# Patient Record
Sex: Female | Born: 1955 | Race: White | Hispanic: No | Smoking: Never smoker
Health system: Southern US, Community
[De-identification: ages and names within clinical notes are randomized; demographics above are authoritative.]

## PROBLEM LIST (undated history)

## (undated) DIAGNOSIS — J45991 Cough variant asthma: Secondary | ICD-10-CM

## (undated) DIAGNOSIS — Z860100 Personal history of colon polyps, unspecified: Secondary | ICD-10-CM

## (undated) DIAGNOSIS — J40 Bronchitis, not specified as acute or chronic: Secondary | ICD-10-CM

## (undated) DIAGNOSIS — K219 Gastro-esophageal reflux disease without esophagitis: Secondary | ICD-10-CM

## (undated) DIAGNOSIS — K2970 Gastritis, unspecified, without bleeding: Secondary | ICD-10-CM

## (undated) HISTORY — PX: TRIGGER FINGER RELEASE: SHX641

## (undated) HISTORY — DX: Bronchitis, not specified as acute or chronic: J40

## (undated) HISTORY — PX: BACK SURGERY: SHX140

## (undated) HISTORY — PX: SEPTOPLASTY: SHX2393

## (undated) HISTORY — PX: BREAST BIOPSY: SHX20

## (undated) HISTORY — PX: NECK SURGERY: SHX720

## (undated) HISTORY — DX: Personal history of colon polyps, unspecified: Z86.0100

## (undated) HISTORY — DX: Cough variant asthma: J45.991

## (undated) HISTORY — PX: DILATION AND CURETTAGE OF UTERUS: SHX78

## (undated) HISTORY — DX: Personal history of colonic polyps: Z86.010

---

## 1997-05-22 ENCOUNTER — Other Ambulatory Visit: Admission: RE | Admit: 1997-05-22 | Discharge: 1997-05-22 | Payer: Self-pay | Admitting: Gynecology

## 1997-09-30 ENCOUNTER — Other Ambulatory Visit: Admission: RE | Admit: 1997-09-30 | Discharge: 1997-09-30 | Payer: Self-pay | Admitting: Gynecology

## 1997-10-12 ENCOUNTER — Other Ambulatory Visit: Admission: RE | Admit: 1997-10-12 | Discharge: 1997-10-12 | Payer: Self-pay | Admitting: Gynecology

## 1997-11-25 ENCOUNTER — Encounter: Payer: Self-pay | Admitting: Emergency Medicine

## 1997-11-25 ENCOUNTER — Emergency Department (HOSPITAL_COMMUNITY): Admission: EM | Admit: 1997-11-25 | Discharge: 1997-11-25 | Payer: Self-pay | Admitting: Emergency Medicine

## 1999-03-02 ENCOUNTER — Encounter: Admission: RE | Admit: 1999-03-02 | Discharge: 1999-03-02 | Payer: Self-pay | Admitting: Plastic Surgery

## 1999-03-02 ENCOUNTER — Encounter: Payer: Self-pay | Admitting: Plastic Surgery

## 2000-09-09 ENCOUNTER — Other Ambulatory Visit: Admission: RE | Admit: 2000-09-09 | Discharge: 2000-09-09 | Payer: Self-pay | Admitting: Gynecology

## 2000-09-17 ENCOUNTER — Other Ambulatory Visit: Admission: RE | Admit: 2000-09-17 | Discharge: 2000-09-17 | Payer: Self-pay | Admitting: Gynecology

## 2000-10-21 ENCOUNTER — Encounter: Admission: RE | Admit: 2000-10-21 | Discharge: 2000-10-21 | Payer: Self-pay | Admitting: Gynecology

## 2000-10-21 ENCOUNTER — Encounter: Payer: Self-pay | Admitting: Gynecology

## 2001-10-23 ENCOUNTER — Encounter: Payer: Self-pay | Admitting: Gynecology

## 2001-10-23 ENCOUNTER — Encounter: Admission: RE | Admit: 2001-10-23 | Discharge: 2001-10-23 | Payer: Self-pay | Admitting: Gynecology

## 2001-10-29 ENCOUNTER — Other Ambulatory Visit: Admission: RE | Admit: 2001-10-29 | Discharge: 2001-10-29 | Payer: Self-pay | Admitting: Gynecology

## 2002-05-22 ENCOUNTER — Encounter: Payer: Self-pay | Admitting: Gynecology

## 2002-05-22 ENCOUNTER — Inpatient Hospital Stay (HOSPITAL_COMMUNITY): Admission: EM | Admit: 2002-05-22 | Discharge: 2002-05-25 | Payer: Self-pay | Admitting: Gynecology

## 2002-05-23 ENCOUNTER — Encounter: Payer: Self-pay | Admitting: Gynecology

## 2003-10-13 ENCOUNTER — Other Ambulatory Visit: Admission: RE | Admit: 2003-10-13 | Discharge: 2003-10-13 | Payer: Self-pay | Admitting: Gynecology

## 2003-11-15 ENCOUNTER — Encounter: Admission: RE | Admit: 2003-11-15 | Discharge: 2003-11-15 | Payer: Self-pay | Admitting: Gynecology

## 2004-10-21 ENCOUNTER — Encounter (INDEPENDENT_AMBULATORY_CARE_PROVIDER_SITE_OTHER): Payer: Self-pay | Admitting: Specialist

## 2004-10-21 ENCOUNTER — Ambulatory Visit (HOSPITAL_COMMUNITY): Admission: RE | Admit: 2004-10-21 | Discharge: 2004-10-21 | Payer: Self-pay | Admitting: Gastroenterology

## 2004-11-02 ENCOUNTER — Other Ambulatory Visit: Admission: RE | Admit: 2004-11-02 | Discharge: 2004-11-02 | Payer: Self-pay | Admitting: Gynecology

## 2005-01-23 ENCOUNTER — Encounter: Admission: RE | Admit: 2005-01-23 | Discharge: 2005-01-23 | Payer: Self-pay | Admitting: Gynecology

## 2006-04-19 ENCOUNTER — Encounter: Admission: RE | Admit: 2006-04-19 | Discharge: 2006-04-19 | Payer: Self-pay | Admitting: Gynecology

## 2006-08-15 ENCOUNTER — Ambulatory Visit (HOSPITAL_BASED_OUTPATIENT_CLINIC_OR_DEPARTMENT_OTHER): Admission: RE | Admit: 2006-08-15 | Discharge: 2006-08-15 | Payer: Self-pay | Admitting: Orthopedic Surgery

## 2007-09-09 ENCOUNTER — Encounter: Admission: RE | Admit: 2007-09-09 | Discharge: 2007-09-09 | Payer: Self-pay | Admitting: Obstetrics and Gynecology

## 2007-09-12 ENCOUNTER — Encounter: Admission: RE | Admit: 2007-09-12 | Discharge: 2007-09-12 | Payer: Self-pay | Admitting: Obstetrics and Gynecology

## 2007-09-12 ENCOUNTER — Encounter (INDEPENDENT_AMBULATORY_CARE_PROVIDER_SITE_OTHER): Payer: Self-pay | Admitting: Radiology

## 2007-10-07 ENCOUNTER — Other Ambulatory Visit: Admission: RE | Admit: 2007-10-07 | Discharge: 2007-10-07 | Payer: Self-pay | Admitting: Obstetrics and Gynecology

## 2008-04-06 ENCOUNTER — Ambulatory Visit (HOSPITAL_BASED_OUTPATIENT_CLINIC_OR_DEPARTMENT_OTHER): Admission: RE | Admit: 2008-04-06 | Discharge: 2008-04-06 | Payer: Self-pay | Admitting: Orthopedic Surgery

## 2008-07-16 ENCOUNTER — Ambulatory Visit (HOSPITAL_BASED_OUTPATIENT_CLINIC_OR_DEPARTMENT_OTHER): Admission: RE | Admit: 2008-07-16 | Discharge: 2008-07-16 | Payer: Self-pay | Admitting: Orthopedic Surgery

## 2008-09-30 ENCOUNTER — Ambulatory Visit: Payer: Self-pay | Admitting: Critical Care Medicine

## 2008-09-30 DIAGNOSIS — J45991 Cough variant asthma: Secondary | ICD-10-CM

## 2008-09-30 DIAGNOSIS — R51 Headache: Secondary | ICD-10-CM

## 2008-09-30 DIAGNOSIS — K219 Gastro-esophageal reflux disease without esophagitis: Secondary | ICD-10-CM

## 2008-09-30 DIAGNOSIS — J40 Bronchitis, not specified as acute or chronic: Secondary | ICD-10-CM

## 2008-09-30 DIAGNOSIS — J309 Allergic rhinitis, unspecified: Secondary | ICD-10-CM | POA: Insufficient documentation

## 2008-09-30 DIAGNOSIS — R519 Headache, unspecified: Secondary | ICD-10-CM | POA: Insufficient documentation

## 2008-10-01 ENCOUNTER — Encounter: Admission: RE | Admit: 2008-10-01 | Discharge: 2008-10-01 | Payer: Self-pay | Admitting: Obstetrics and Gynecology

## 2008-10-03 ENCOUNTER — Encounter: Payer: Self-pay | Admitting: Critical Care Medicine

## 2008-10-19 ENCOUNTER — Ambulatory Visit: Payer: Self-pay | Admitting: Critical Care Medicine

## 2008-12-11 ENCOUNTER — Emergency Department (HOSPITAL_BASED_OUTPATIENT_CLINIC_OR_DEPARTMENT_OTHER): Admission: EM | Admit: 2008-12-11 | Discharge: 2008-12-11 | Payer: Self-pay | Admitting: Emergency Medicine

## 2008-12-11 ENCOUNTER — Ambulatory Visit: Payer: Self-pay | Admitting: Radiology

## 2009-02-07 ENCOUNTER — Telehealth: Payer: Self-pay | Admitting: Critical Care Medicine

## 2009-07-07 ENCOUNTER — Encounter: Payer: Self-pay | Admitting: Critical Care Medicine

## 2009-07-07 ENCOUNTER — Telehealth (INDEPENDENT_AMBULATORY_CARE_PROVIDER_SITE_OTHER): Payer: Self-pay | Admitting: *Deleted

## 2009-07-08 ENCOUNTER — Telehealth: Payer: Self-pay | Admitting: Critical Care Medicine

## 2009-07-08 ENCOUNTER — Ambulatory Visit: Payer: Self-pay | Admitting: Critical Care Medicine

## 2009-07-19 ENCOUNTER — Ambulatory Visit: Payer: Self-pay | Admitting: Critical Care Medicine

## 2009-11-08 ENCOUNTER — Ambulatory Visit: Payer: Self-pay | Admitting: Internal Medicine

## 2009-11-08 DIAGNOSIS — Z860100 Personal history of colon polyps, unspecified: Secondary | ICD-10-CM | POA: Insufficient documentation

## 2009-11-08 DIAGNOSIS — B029 Zoster without complications: Secondary | ICD-10-CM | POA: Insufficient documentation

## 2010-03-26 ENCOUNTER — Encounter: Payer: Self-pay | Admitting: Otolaryngology

## 2010-04-04 NOTE — Progress Notes (Signed)
Summary: Needs Appt  Phone Note Outgoing Call   Reason for Call: Confirm/change Appt Summary of Call: make an appt for this pt with me next week I sent pred pulse to her pharmacy Initial call taken by: Storm Frisk MD,  Jul 07, 2009 10:07 AM  Follow-up for Phone Call        called pt at her office and [facility] with the secretary for pt to call back once she is out of surgery to set appt. Carron Curie CMA  Jul 07, 2009 10:19 AM   Additional Follow-up for Phone Call Additional follow up Details #1::        Pt has scheduled ov on May 13 at 330 with the schedulers.  Will sign off on note.  Gweneth Dimitri RN  Jul 07, 2009 4:59 PM

## 2010-04-04 NOTE — Miscellaneous (Signed)
Summary: Pred Rx  Clinical Lists Changes  Medications: Added new medication of PREDNISONE 10 MG  TABS (PREDNISONE) Take as directed Take 4 daily for two days, then 3 daily for two days, then two daily for two days then one daily for two days then stop - Signed Rx of PREDNISONE 10 MG  TABS (PREDNISONE) Take as directed Take 4 daily for two days, then 3 daily for two days, then two daily for two days then one daily for two days then stop;  #20 x 0;  Signed;  Entered by: Storm Frisk MD;  Authorized by: Storm Frisk MD;  Method used: [facility] [address] [phone]*, [address] [place], [place], [place]  [address], Ph: [phone] or [phone], Fax: [phone]    Prescriptions: PREDNISONE 10 MG  TABS (PREDNISONE) Take as directed Take 4 daily for two days, then 3 daily for two days, then two daily for two days then one daily for two days then stop  #20 x 0   Entered and Authorized by:   Storm Frisk MD   Signed by:   Storm Frisk MD on 07/07/2009   Method used:   [facility]ronically to        [facility] [address] [phone]* (retail)       [address]       [place], [place]  [address]       Ph: [phone] or [phone]       Fax: [phone]   RxID:   [id number]

## 2010-04-04 NOTE — Assessment & Plan Note (Signed)
Summary: Pulmonary OV   Copy to:  collins Primary Provider/Referring Provider:  Dr. Jeri Modena  CC:  Follow up.  Pt states SOB when talking and prod cough with white to clear mucus and occ creamy mucus returned x 1 1/2 wks.  .  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  October 19, 2008 3:47 PM Pt is markedly better. The cough has resolved.  There is no wheeze.  Am PFR 450 and improves to 550 by evening. No pn drip. Not using rescue inhaler Pt denies any significant sore throat, nasal congestion or excess secretions, fever, chills, sweats, unintended weight loss, pleurtic or exertional chest pain, orthopnea PND, or leg swelling Pt denies any increase in rescue therapy over baseline, denies waking up needing it or having any early am or nocturnal exacerbations of coughing/wheezing/or dyspnea.  Jul 08, 2009 3:52 PM Same symptoms as last summer.  all went away and weaned off zyrtec , inhalers off since 8/10 symptoms return in 2/11.  more pndrip,  ent suggested .  flonase and astelin plus zyrtec drainage better until mid march, then strep throat and ent rx cleocin for 10days then ok until one week cough came back.  seal like barky cough and still pndrip had stopped all the nasal meds and zyrtec 3weeks ago. then went back on all three allergy meds,  then this week tuesday worse with talking resumed qvar, and proair as needed.   not much better for two days did get pred and took 40mg  last night   Preventive Screening-Counseling & Management  Alcohol-Tobacco     Smoking Status: never  Current Medications (verified): 1)  Protonix 40 Mg Tbec (Pantoprazole Sodium) .... Once Daily 2)  Topamax 25 Mg Tabs (Topiramate) .... 3 Tabs At Bedtime As Needed 3)  Zomig 5 Mg Tabs (Zolmitriptan) .... As Needed For Headaches 4)  Qvar 40 Mcg/act  Aers  (Beclomethasone Dipropionate) .... Two Puffs  Daily 5)  Proair Hfa 108 (90 Base) Mcg/act  Aers (Albuterol Sulfate) .[name].. 1-2 Puffs Every 4-6 Hours As Needed 6)  Aerochamber Mv  Misc (Spacer/aero-Holding Chambers) .... Use With Qvar 7)  Prednisone 10 Mg  Tabs (Prednisone) .... Take As Directed Take 4 Daily For Two Days, Then 3 Daily For Two Days, Then Two Daily For Two Days Then One Daily For Two Days Then Stop 8)  Zyrtec Allergy 10 Mg Tabs (Cetirizine Hcl) .... Once Daily 9)  Azelastine Hcl 137 Mcg/spray Soln (Azelastine Hcl) .... 2 Sprays Two Times A Day 10)  Fluticasone Propionate 50 Mcg/act Susp (Fluticasone Propionate) .... 2 Sprays Once Daily 11)  Mucinex Dm 30-600 Mg Xr12h-Tab (Dextromethorphan-Guaifenesin) .... Once Daily  Allergies (verified): 1)  Sulfa  Past History:  Past medical, surgical, family and social histories (including risk factors) reviewed, and no changes noted (except as noted below).  Past Medical History: Reviewed history from 09/30/2008 and no changes required. BRONCHITIS (ICD-490) HEADACHE, CHRONIC (ICD-784.0)  Past Surgical History: Reviewed history from 09/30/2008 and no changes required. neck/back surgery triger finger releases septo plasty D&C Breast Bx  Family History: Reviewed history from 09/30/2008 and no changes required. DGU:YQIHKV, father, 2 siblings QQV:ZDGLOV Heart attack:father Lupus:maternal aunt arthritis:mother breast CA-maternal aunt prostate CA;maternal uncle  Social History: Reviewed history from 09/30/2008 and no changes required. Patient never smoked.  Social drinker Married, lives with husband No biological  childern, does have stepchidren Pt is a plastic surgian.  Review of Systems       The patient complains of shortness of breath with activity, shortness of breath at rest, productive cough, non-productive cough, sore throat, and nasal congestion/difficulty breathing through nose.  The patient denies coughing up blood,  chest pain, irregular heartbeats, acid heartburn, indigestion, loss of appetite, weight change, abdominal pain, difficulty swallowing, tooth/dental problems, headaches, sneezing, itching, ear ache, anxiety, depression, hand/feet swelling, joint stiffness or pain, rash, change in color of mucus, and fever.    Vital Signs:  Patient profile:   55 year old female Height:      66 inches Weight:      144 pounds BMI:     23.33 O2 Sat:      96 % on Room air Temp:     97.9 degrees F oral Pulse rate:   88 / minute BP sitting:   140 / 90  (right arm) Cuff size:   regular  Vitals Entered By: Gweneth Dimitri RN (Jul 08, 2009 3:43 PM)  O2 Flow:  Room air CC: Follow up.  Pt states SOB when talking and prod cough with white to clear mucus and occ creamy mucus returned x 1 1/2 wks.   Comments Medications reviewed with patient Daytime contact number verified with patient. Gweneth Dimitri RN  Jul 08, 2009 3:44 PM    Physical Exam  Additional Exam:  Gen: Pleasant, well-nourished, in no distress,  normal affect ENT: No lesions,  mouth clear,  oropharynx clear, positive pndrip,  erythematous  Neck: No JVD, no TMG, no carotid bruits Lungs: No use of accessory muscles, no dullness to percussion, upper airway wheeze Cardiovascular: RRR, heart sounds normal, no murmur or gallops, no peripheral edema Abdomen: soft and NT, no HSM,  BS normal Musculoskeletal: No deformities, no cyanosis or clubbing Neuro: alert, non focal Skin: Warm, no lesions or rashes    Pulmonary Function Test Date: 07/08/2009 4:06 PM Gender: Female  Pre-Spirometry FVC    Value: 3.37 L/min   % Pred: 90.70 % FEV1    Value: 2.72 L     Pred: 2.93 L     % Pred: 92.80 % FEV1/FVC  Value: 80.64 %     % Pred: 101.10 %  Impression & Recommendations:  Problem # 1:  BRONCHITIS (ICD-490) Assessment Deteriorated Acute tracheobronchits and GERD flare both result in mild asthma flare and upper airway instability syndrome/vocal cord  dysfunction Note normal spirometry plan Zpak prednisone pulse resume qvar as needed tramadol for cough   The following medications were removed from the medication list:    Zithromax Z-pak 250 Mg Tabs (Azithromycin) .[name]... Take 2 tablets today and then one tablet daily until gone. Her updated medication list for this problem includes:    Qvar 40 Mcg/act Aers (Beclomethasone dipropionate) .[name]... Three puffs twice daily for 5 days then two puff twice a day for two cannisters then stop    Proair Hfa 108 (90 Base) Mcg/act Aers (Albuterol sulfate) .[name]... 1-2 puffs every 4-6 hours as needed    Mucinex Dm 30-600 Mg Xr12h-tab (Dextromethorphan-guaifenesin) ..... Once daily    Azithromycin 250 Mg Tabs (Azithromycin) .[name]... Take two by mouth once then one daily until gone  Orders: Est. Patient Level IV (98119)  Medications Added to Medication List This Visit: 1)  Topamax 25 Mg Tabs (Topiramate) .... 3 tabs at bedtime as needed 2)  Qvar 40 Mcg/act Aers (Beclomethasone dipropionate) .... Three puffs twice daily for  5 days then two puff twice a day for two cannisters then stop 3)  Qvar 40 Mcg/act Aers (Beclomethasone dipropionate) .... Three puffs twice daily 4)  Zyrtec Allergy 10 Mg Tabs (Cetirizine hcl) .... Once daily 5)  Azelastine Hcl 137 Mcg/spray Soln (Azelastine hcl) .... 2 sprays two times a day 6)  Fluticasone Propionate 50 Mcg/act Susp (Fluticasone propionate) .... 2 sprays once daily 7)  Mucinex Dm 30-600 Mg Xr12h-tab (Dextromethorphan-guaifenesin) .... Once daily 8)  Proair Hfa 108 (90 Base) Mcg/act Aers (Albuterol sulfate) .[name].. 1-2 puffs every 4-6 hours as needed 9)  Azithromycin 250 Mg Tabs (Azithromycin) .... Take two by mouth once then one daily until gone 10)  Tramadol Hcl 50 Mg Tabs (Tramadol hcl) .... One to two by mouth every 4-6 hours as needed cough  Complete Medication List: 1)  Protonix 40 Mg Tbec (Pantoprazole sodium) .... Once daily 2)  Topamax 25 Mg Tabs (Topiramate) ....  3 tabs at bedtime as needed 3)  Zomig 5 Mg Tabs (Zolmitriptan) .... As needed for headaches 4)  Qvar 40 Mcg/act Aers (Beclomethasone dipropionate) .... Three puffs twice daily for 5 days then two puff twice a day for two cannisters then stop 5)  Proair Hfa 108 (90 Base) Mcg/act Aers (Albuterol sulfate) .[name].. 1-2 puffs every 4-6 hours as needed 6)  Aerochamber Mv Misc (Spacer/aero-holding chambers) .... Use with qvar 7)  Prednisone 10 Mg Tabs (Prednisone) .... Take as directed take 4 daily for two days, then 3 daily for two days, then two daily for two days then one daily for two days then stop 8)  Zyrtec Allergy 10 Mg Tabs (Cetirizine hcl) .... Once daily 9)  Azelastine Hcl 137 Mcg/spray Soln (Azelastine hcl) .... 2 sprays two times a day 10)  Fluticasone Propionate 50 Mcg/act Susp (Fluticasone propionate) .... 2 sprays once daily 11)  Mucinex Dm 30-600 Mg Xr12h-tab (Dextromethorphan-guaifenesin) .... Once daily 12)  Proair Hfa 108 (90 Base) Mcg/act Aers (Albuterol sulfate) .[name].. 1-2 puffs every 4-6 hours as needed 13)  Azithromycin 250 Mg Tabs (Azithromycin) .... Take two by mouth once then one daily until gone 14)  Tramadol Hcl 50 Mg Tabs (Tramadol hcl) .... One to two by mouth every 4-6 hours as needed cough  Other Orders: Spirometry w/Graph (94010)  Patient Instructions: 1)  Qvar three puff twice daily for 5 days , then two puff twice daily 2)  Finish prednisone 3)  Take zithromax , two times one dose, then one daily until gone 4)  Stay on nasal sprays , zyrtec 5)  Stay on protonix, remember to take 1/2 hour before meals then eat 6)  Stop mucinex DM 7)  Use Tramadol one to two every 6 hours as needed for cough 8)  Call in one week with an update Prescriptions: TRAMADOL HCL 50 MG  TABS (TRAMADOL HCL) One to two by mouth every 4-6 hours as needed cough  #30 x 0   Entered and Authorized by:   Storm Frisk MD   Signed by:   Storm Frisk MD on 07/08/2009   Method used:    [facility]        [facility] [address] [phone]* (retail)       [address]       [place], [place]  [address]       Ph: [phone] or [phone]       Fax: [phone]   RxID:   [id number] AZITHROMYCIN  250 MG TABS (AZITHROMYCIN) Take two by mouth once then one daily until gone  #7 x 0   Entered and Authorized by:   Storm Frisk MD   Signed by:   Storm Frisk MD on 07/08/2009   Method used:   [facility]ronically to        [facility] [address] [phone]* (retail)       [address]       [place], [place]  [address]       Ph: [phone] or [phone]       Fax: [phone]   RxID:   [id number] QVAR 40 MCG/ACT  AERS (BECLOMETHASONE DIPROPIONATE) Three puffs twice daily for 5 days then two puff twice a day for two cannisters then stop  #1 x 1   Entered and Authorized by:   Storm Frisk MD   Signed by:   Storm Frisk MD on 07/08/2009   Method used:   Electronically to        [facility] [address] [phone]* (retail)       [address]       [place], [place]  [address]       Ph: [phone] or [phone]       Fax: [phone]   RxID:   [id number]   Prevention & Chronic Care Immunizations   Influenza vaccine: Not documented    Tetanus booster: Not documented    Pneumococcal vaccine: Not documented  Colorectal Screening   Hemoccult: Not documented    Colonoscopy: Not documented  Other Screening   Pap smear: Not documented    Mammogram: Not documented   Smoking status: never  (07/08/2009)  Lipids   Total Cholesterol: Not documented   LDL: Not documented   LDL Direct: Not documented   HDL: Not documented   Triglycerides: Not documented   CardioPerfect Spirometry  ID: [id number] Patient: Whitney [name], DESSERT DOB: 08/15/1955 Age: 55 Years Old Sex: Female Race: White Height: 66 Weight: 144 Status: Confirmed Past Medical History:  BRONCHITIS  (ICD-490) HEADACHE, CHRONIC (ICD-784.0)   Recorded: 07/08/2009 4:06 PM  Parameter  Measured Predicted %Predicted FVC     3.37        3.72        90.70 FEV1     2.72        2.93        92.80 FEV1%   80.64        79.76        101.10 PEF    7.42        6.94        107   Comments: normal spirometry  Interpretation: Pre: FVC= 3.37L FEV1= 2.72L FEV1%= 80.6% 2.72/3.37 FEV1/FVC (07/08/2009 4:07:17 PM), Within normal limits

## 2010-04-04 NOTE — Progress Notes (Signed)
Summary: appt  Phone Note Call from Patient Call back at Work Phone [phone]   Caller: Patient Call For: wright Summary of Call: Wants to see PW today also needs directions for how she is to take prednisone. Initial call taken by: Darletta Moll,  Jul 08, 2009 9:07 AM  Follow-up for Phone Call        pt wants to be worked today at 3:30 or later instead of coming in end of next week. Please advise if ok to schedule appt. Thanks.Carron Curie CMA  Jul 08, 2009 9:39 AM  TD spoke with Dr. Delford Field, ok to place pt at 3:30. pt aware. she will doscuss pred at the appt. Carron Curie CMA  Jul 08, 2009 9:47 AM

## 2010-04-04 NOTE — Assessment & Plan Note (Signed)
Summary: NEW / Whitney [name] / CD   Vital Signs:  Patient profile:   55 year old female Height:      66 inches Weight:      146 pounds BMI:     23.65 O2 Sat:      96 % on Room air Temp:     97.5 degrees F oral Pulse rate:   80 / minute BP sitting:   122 / 62  (left arm) Cuff size:   regular  Vitals Entered By: Alysia Penna (November 08, 2009 4:51 PM)  O2 Flow:  Room air CC: pt has bug bites on multiple locations. soreness on back/buttocks area. some itching & joint pain. /cp sma Comments pt not using proair or tramadol.    Primary Care Provider:  Dr. Jeri Modena  CC:  pt has bug bites on multiple locations. soreness on back/buttocks area. some itching & joint pain. /cp sma.  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 She thought she may have had a insect envenomation but the pain has become very bothersome involving the right buttock to the top of the posterior thigh. She has scattered mild upper extremity lesions that are very small and not painful. She has applied a drying agent and some topical antibiotic to the lesion on the buttock and sacrum. She first noted the "bite" Friday but has had new "bites" and the pain has been noticeable for the past 2-3 days.   Current Medications (verified): 1)  Protonix 40 Mg Tbec (Pantoprazole Sodium) .... Once Daily 2)  Topamax 25 Mg Tabs (Topiramate) .... 3 Tabs At Bedtime As Needed 3)  Zomig 5 Mg Tabs (Zolmitriptan) .... As Needed For Headaches 4)  Zyrtec Allergy 10 Mg Tabs (Cetirizine Hcl) .... Once Daily 5)  Azelastine Hcl 137 Mcg/spray Soln (Azelastine Hcl) .... 2 Sprays Two Times A Day 6)  Fluticasone Propionate 50 Mcg/act Susp (Fluticasone Propionate) .... 2 Sprays Once Daily 7)  Proair Hfa 108 (90 Base) Mcg/act  Aers (Albuterol Sulfate) .[name].. 1-2 Puffs Every 4-6 Hours As Needed 8)  Tramadol Hcl 50 Mg  Tabs (Tramadol Hcl) .... 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 Thomasena Edis - who is moving to [facility] - will need new gyn (Sept '11)                   GI -    Dr. Lorelle Formosa -   Dr. Delford Field  Past Surgical History: Reviewed history from 09/30/2008 and no changes required. neck/back surgery triger finger releases septo plasty D&C Breast Bx  Social History: Patient never smoked.  Social drinker Married, lives with husband No biological childern, does have stepchidren Pt is a Engineer, petroleum.  Review of Systems  The patient denies anorexia, fever, weight loss, weight gain, chest pain, dyspnea on exertion, abdominal pain, and enlarged lymph nodes.    Physical Exam  General:  alert, well-developed, well-nourished, and well-hydrated.  Head:  normocephalic.   Eyes:  pupils equal, pupils round, and corneas and lenses clear.   Lungs:  normal respiratory effort.   Heart:  normal rate and regular rhythm.   Skin:  sacrum and buttock with several erythematous papular lesions with a cluster just right of the main sacral lesion and a 4 lesion clust at the right buttock.   Several isolated mild erythematous lesions UEs Psych:  Oriented X3, normally interactive, good eye contact, and not anxious appearing.     Impression & Recommendations:  Problem # 1:  SHINGLES (ICD-053.9)  outbreak on sacrum/buttock with red raised lesions in crops in a swath just on the right (dermatomal)  with a disproportionate amount of pain strongly suggests zoster.  Plan - Valtrex 1g three times a day x 7           prednisone 20mg  once daily x 10  Orders: No Charge Patient Arrived (NCPA0)  (NCPA0)  Complete Medication List: 1)  Protonix 40 Mg Tbec (Pantoprazole sodium) .... Once daily 2)  Topamax 25 Mg Tabs (Topiramate) .... 3 tabs at bedtime as needed 3)  Zomig 5 Mg Tabs (Zolmitriptan) .... As needed for headaches 4)  Zyrtec Allergy 10 Mg Tabs (Cetirizine hcl) .... Once daily 5)  Azelastine Hcl 137 Mcg/spray Soln (Azelastine hcl) .... 2 sprays two times a day 6)  Fluticasone Propionate 50 Mcg/act Susp (Fluticasone propionate) .... 2 sprays once daily 7)  Proair Hfa 108 (90 Base) Mcg/act Aers (Albuterol sulfate) .[name].. 1-2 puffs every 4-6 hours as needed 8)  Tramadol Hcl 50 Mg Tabs (Tramadol hcl) .... One to two by mouth every 4-6 hours as needed cough 9)  Qvar 40 Mcg/act Aers (Beclomethasone dipropionate) 10)  Valtrex 1 Gm Tabs (Valacyclovir hcl) .[name].. 1 by mouth three times a day 11)  Prednisone 20 Mg Tabs (Prednisone) .[name].. 1 by mouth once daily x 10 days for phn Prescriptions: PREDNISONE 20 MG TABS (PREDNISONE) 1 by mouth once daily x 10 days for PHN  #10 x 0   Entered and Authorized by:   Jacques Navy MD   Signed by:   Jacques Navy MD on 11/08/2009   Method used:   [facility]ronically to        [facility] [address] [phone]* (retail)       [address]       [place], [place]  [address]       Ph: [phone] or [phone]       Fax: [phone]   RxID:   [id number] VALTREX 1 GM TABS (VALACYCLOVIR HCL) 1 by mouth three times a day  #30 x 0   Entered and Authorized by:   Jacques Navy MD   Signed by:   Jacques Navy MD on 11/08/2009   Method used:   Electronically to        [facility] [address] [phone]* (retail)       [address]       [place], [place]  [address]       Ph: [phone] or [phone]       Fax: [phone]   RxID:   [id number]    Preventive Care Screening  Colonoscopy:    Date:  10/18/2004    Results:  Hyperplastic Polyp

## 2010-04-04 NOTE — Assessment & Plan Note (Signed)
Summary: Pulmonary OV   Copy to:  [facility] Primary Provider/Referring Provider:  Dr. Jeri Modena  CC:  1 wk follow up.  States breathing and cough are "under control."  States she is still having a lot of secretions-clear to white.  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  October 19, 2008 3:47 PM Pt is markedly better. The cough has resolved.  There is no wheeze.  Am PFR 450 and improves to 550 by evening. No pn drip. Not using rescue inhaler Pt denies any significant sore throat, nasal congestion or excess secretions, fever, chills, sweats, unintended weight loss, pleurtic or exertional chest pain, orthopnea PND, or leg swelling Pt denies any increase in rescue therapy over baseline, denies waking up needing it or having any early am or nocturnal exacerbations of coughing/wheezing/or dyspnea.  Jul 08, 2009 3:52 PM Same symptoms as last summer.  all went away and weaned off zyrtec , inhalers off since 8/10 symptoms return in 2/11.  more pndrip,  ent suggested .  flonase and astelin plus zyrtec drainage better until mid march, then strep throat and ent rx cleocin for 10days then ok until one week cough came back.  seal like barky cough and still pndrip had stopped all the nasal meds and zyrtec 3weeks ago. then went back on all three allergy meds,  then this week tuesday worse with talking resumed qvar, and proair as needed.   not much better for two days did get pred and took 40mg  last night Jul 19, 2009 1:41 PM Not as bad with cough,  now only occasionally,   muocus is clear,  still with pn drip no heart burn.  not dyspneic tramadol helped. and did not hurt much the pt is not hoarse overall is much improved    Preventive Screening-Counseling & Management  Alcohol-Tobacco     Smoking Status: never  Current Medications  (verified): 1)  Protonix 40 Mg Tbec (Pantoprazole Sodium) .... Once Daily 2)  Topamax 25 Mg Tabs (Topiramate) .... 3 Tabs At Bedtime As Needed 3)  Zomig 5 Mg Tabs (Zolmitriptan) .... As Needed For Headaches 4)  Qvar 40 Mcg/act  Aers (Beclomethasone Dipropionate) .... Three Puffs Twice Daily For 5 Days Then Two Puff Twice A Day For Two Cannisters Then Stop 5)  Aerochamber Mv  Misc (Spacer/aero-Holding Chambers) .... Use With Qvar 6)  Zyrtec Allergy 10 Mg Tabs (Cetirizine Hcl) .... Once Daily 7)  Azelastine Hcl 137 Mcg/spray Soln (Azelastine Hcl) .... 2 Sprays Two Times A Day 8)  Fluticasone Propionate 50 Mcg/act Susp (Fluticasone Propionate) .... 2 Sprays Once Daily 9)  Proair Hfa 108 (90 Base) Mcg/act  Aers (Albuterol Sulfate) .Whitney [name].. 1-2 Puffs Every 4-6 Hours As Needed 10)  Tramadol Hcl 50 Mg  Tabs (Tramadol Hcl) .... One To Two By Mouth Every 4-6 Hours As Needed Cough  Allergies (verified): 1)  Sulfa  Past History:  Past medical, surgical, family and social histories (including risk factors) reviewed, and no changes noted (except as noted below).  Past Medical History: Reviewed history from 09/30/2008 and no changes required. BRONCHITIS (ICD-490) HEADACHE, CHRONIC (ICD-784.0)  Past Surgical History: Reviewed history from 09/30/2008 and no changes required. neck/back surgery triger finger releases septo plasty D&C Breast Bx  Family History: Reviewed history from 09/30/2008 and no changes required. EAV:WUJWJX, father, 2 siblings BJY:NWGNFA Heart  attack:father Lupus:maternal aunt arthritis:mother breast CA-maternal aunt prostate CA;maternal uncle  Social History: Reviewed history from 09/30/2008 and no changes required. Patient never smoked.  Social drinker Married, lives with husband No biological childern, does have stepchidren Pt is a Proofreader.  Review of Systems       The patient complains of non-productive cough.  The patient denies shortness of breath with  activity, shortness of breath at rest, productive cough, coughing up blood, chest pain, irregular heartbeats, acid heartburn, indigestion, loss of appetite, weight change, abdominal pain, difficulty swallowing, sore throat, tooth/dental problems, headaches, nasal congestion/difficulty breathing through nose, sneezing, itching, ear ache, anxiety, depression, hand/feet swelling, joint stiffness or pain, rash, change in color of mucus, and fever.    Vital Signs:  Patient profile:   55 year old female Height:      66 inches Weight:      152 pounds BMI:     24.62 O2 Sat:      98 % on Room air Temp:     98.1 degrees F oral Pulse rate:   85 / minute BP sitting:   110 / 70  (right arm) Cuff size:   regular  Vitals Entered By: Gweneth Dimitri RN (Jul 19, 2009 1:54 PM)  O2 Flow:  Room air CC: 1 wk follow up.  States breathing and cough are "under control."  States she is still having a lot of secretions-clear to white.   Comments Medications reviewed with patient Daytime contact number verified with patient. Gweneth Dimitri RN  Jul 19, 2009 1:55 PM    Physical Exam  Additional Exam:  Gen: Pleasant, well-nourished, in no distress,  normal affect ENT: No lesions,  mouth clear,  oropharynx clear, positive pndrip, less  erythematous  Neck: No JVD, no TMG, no carotid bruits Lungs: No use of accessory muscles, no dullness to percussion, upper airway wheeze Cardiovascular: RRR, heart sounds normal, no murmur or gallops, no peripheral edema Abdomen: soft and NT, no HSM,  BS normal Musculoskeletal: No deformities, no cyanosis or clubbing Neuro: alert, non focal Skin: Warm, no lesions or rashes    Impression & Recommendations:  Problem # 1:  ALLERGIC RHINITIS (ICD-477.9) Assessment Improved Improved allergic rhinitis as cause for cyclical cough plan cont zyrtec and flonase wean astelin wean qvar to off as needed tramadol  Her updated medication list for this problem includes:    Zyrtec  Allergy 10 Mg Tabs (Cetirizine hcl) ..... Once daily    Azelastine Hcl 137 Mcg/spray Soln (Azelastine hcl) .Whitney [name]... 2 sprays two times a day    Fluticasone Propionate 50 Mcg/act Susp (Fluticasone propionate) .Whitney [name]... 2 sprays once daily  Orders: Est. Patient Level III (29528)  Problem # 2:  GERD (ICD-530.81) Assessment: Improved GERD  plan  cont ppi Her updated medication list for this problem includes:    Protonix 40 Mg Tbec (Pantoprazole sodium) ..... Once daily  Medications Added to Medication List This Visit: 1)  Qvar 40 Mcg/act Aers (Beclomethasone dipropionate) .... Two puff daily until current cannister is out then stop  Complete Medication List: 1)  Protonix 40 Mg Tbec (Pantoprazole sodium) .... Once daily 2)  Topamax 25 Mg Tabs (Topiramate) .... 3 tabs at bedtime as needed 3)  Zomig 5 Mg Tabs (Zolmitriptan) .... As needed for headaches 4)  Qvar 40 Mcg/act Aers (Beclomethasone dipropionate) .... Two puff daily until current cannister is out then stop 5)  Aerochamber Mv Misc (Spacer/aero-holding chambers) .... Use with qvar 6)  Zyrtec Allergy 10 Mg Tabs (  Cetirizine hcl) .... Once daily 7)  Azelastine Hcl 137 Mcg/spray Soln (Azelastine hcl) .... 2 sprays two times a day 8)  Fluticasone Propionate 50 Mcg/act Susp (Fluticasone propionate) .... 2 sprays once daily 9)  Proair Hfa 108 (90 Base) Mcg/act Aers (Albuterol sulfate) .Whitney [name].. 1-2 puffs every 4-6 hours as needed 10)  Tramadol Hcl 50 Mg Tabs (Tramadol hcl) .... One to two by mouth every 4-6 hours as needed cough  Patient Instructions: 1)  Reduce Qvar to two puff daily until current cannister is finished then stop 2)  No more prednisone or antibiotic 3)  Stay on Zyrtec and astelin for two more weeks then stop astelin but stay on zyrtec daily 4)  Stay on flonase daily 5)  Tramadol for severe cough spells 6)  Return as needed or if worsens Prescriptions: FLUTICASONE PROPIONATE 50 MCG/ACT SUSP (FLUTICASONE PROPIONATE) 2 sprays once  daily  #1 x 6   Entered and Authorized by:   Storm Frisk MD   Signed by:   Storm Frisk MD on 07/19/2009   Method used:   Electronically to        [facility] [address] [phone]* (retail)       [address]       [place], [place]  [address]       Ph: [phone] or [phone]       Fax: [phone]   RxID:   [id number]

## 2010-05-23 ENCOUNTER — Other Ambulatory Visit: Payer: Self-pay | Admitting: Internal Medicine

## 2010-05-23 DIAGNOSIS — Z1231 Encounter for screening mammogram for malignant neoplasm of breast: Secondary | ICD-10-CM

## 2010-05-29 ENCOUNTER — Ambulatory Visit
Admission: RE | Admit: 2010-05-29 | Discharge: 2010-05-29 | Disposition: A | Discharge status: Home / Self Care | Payer: Self-pay | Source: Ambulatory Visit | Attending: Internal Medicine | Admitting: Internal Medicine

## 2010-05-29 DIAGNOSIS — Z1231 Encounter for screening mammogram for malignant neoplasm of breast: Secondary | ICD-10-CM

## 2010-05-30 ENCOUNTER — Ambulatory Visit: Payer: Self-pay

## 2010-06-08 LAB — COMPREHENSIVE METABOLIC PANEL
AST: 35 U/L (ref 0–37)
Albumin: 4.4 g/dL (ref 3.5–5.2)
BUN: 18 mg/dL (ref 6–23)
Calcium: 9.5 mg/dL (ref 8.4–10.5)
GFR calc Af Amer: >60 mL/min (ref >60)
Potassium: 4.2 mEq/L (ref 3.5–5.1)
Total Bilirubin: 0.6 mg/dL (ref 0.3–1.2)
Total Protein: 7.2 g/dL (ref 6.0–8.3)

## 2010-06-08 LAB — CBC
HCT: 39.4 % (ref 36.0–46.0)
Hemoglobin: 13.7 g/dL (ref 12.0–15.0)
MCHC: 34.8 g/dL (ref 30.0–36.0)
MCV: 96.6 fL (ref 78.0–100.0)
RBC: 4.08 MIL/uL (ref 3.87–5.11)
RDW: 12.4 % (ref 11.5–15.5)
WBC: 6.1 10*3/uL (ref 4.0–10.5)

## 2010-06-08 LAB — DIFFERENTIAL
Basophils Absolute: 0 10*3/uL (ref 0.0–0.1)
Basophils Relative: 0 % (ref 0–1)
Eosinophils Absolute: 0 10*3/uL (ref 0.0–0.7)
Lymphs Abs: 2.1 10*3/uL (ref 0.7–4.0)
Neutro Abs: 3.8 10*3/uL (ref 1.7–7.7)

## 2010-07-18 NOTE — Op Note (Signed)
[name], [name]           ACCOUNT NO.:  [id number]   MEDICAL RECORD NO.:  [id number]          PATIENT TYPE:  AMB   LOCATION:  DSC                          FACILITY:  [facility]   PHYSICIAN:  Katy Fitch. Sypher, M.D. DATE OF BIRTH:  1955/03/24   DATE OF PROCEDURE:  07/16/2008  DATE OF DISCHARGE:                               OPERATIVE REPORT   PREOPERATIVE DIAGNOSIS:  Chronic stenosing tenosynovitis, left small  finger A1 pulley.   PREOPERATIVE DIAGNOSIS:  Chronic stenosing tenosynovitis, left small  finger A1 pulley.   OPERATION:  Release of left small finger A1 pulley.   OPERATING SURGEON:  Katy Fitch. Sypher, MD   ASSISTANT:  Marveen Reeks Dasnoit, PA-C   ANESTHESIA:  Lidocaine 2% field block and flexor sheath block left small  finger.  No supplemental sedation was provided.  This was a minor  operating room procedure.   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  She  has failed nonoperative measures including steroid injection, activity  modification, and anti-inflammatory medication.   Due to a failed respond to these measures, she is brought to the  operating room at this time for release of the A1 pulley.   PROCEDURE:  Whitney [name] was brought to the operating room and  placed in supine position on the operating table.  Following routine  Betadine paint, 2% lidocaine was infiltrated into the flexor sheath and  superficially in the region of the anticipated incision after 5 minutes  of adequate anesthesia was achieved.  The arm was then prepped with  Betadine soap solution and sterilely draped.  A pneumatic tourniquet was  applied to the proximal brachium.   Following exsanguination of the hand and arm by direct compression, the  tourniquet was inflated to 230 mmHg.  Procedure commenced with a short  transverse incision directly over the palpably thickened A1 pulley.  Subcutaneous issues were meticulously dissected revealing the A1 pulley.  The pulley was split with scalpel and scissors.  The flexor tendons were  edematous and invested with inflammatory tenosynovium.  After release of  the pulley, flexor active range of motion was recovered.  The wound was  repaired with a intradermal 3-0 Prolene and Steri-Strips.   The hand was addressed with sterile gauze and a Coban dressing.  Dr.  Sherald Hess will remove the dressing in 3 days and apply Tegaderm.  At  8 days, she will remove her suture.   We will see her back for followup in approximately 2 weeks.  There were  no apparent complications.  For aftercare, she was encouraged to use  Tylenol, Aleve, or Vicodin for pain.  She was provided prescription for  Vicodin 5 mg one p.o. q.4-6 h. p.r.n. pain, 20 tablets without refill.      Katy Fitch Sypher, M.D.  Electronically Signed     RVS/MEDQ  D:  07/16/2008  T:  07/16/2008  Job:  [id number]

## 2010-07-18 NOTE — Op Note (Signed)
Whitney [name], Whitney [name]           ACCOUNT NO.:  [id number]   MEDICAL RECORD NO.:  [id number]          PATIENT TYPE:  AMB   LOCATION:  DSC                          FACILITY:  [facility]   PHYSICIAN:  Katy Fitch. Sypher, M.D. DATE OF BIRTH:  June 06, 1955   DATE OF PROCEDURE:  08/15/2006  DATE OF DISCHARGE:                               OPERATIVE REPORT   PREOPERATIVE DIAGNOSIS:  Chronic stenosing tenosynovitis, right first  dorsal compartment, with secondary ganglion cyst formation in the  retinacular wall, status post failure to respond to nonoperative  measures including steroid injection, activity modification, and  splinting.   POSTOPERATIVE DIAGNOSIS:  Chronic stenosing tenosynovitis, right first  dorsal compartment, with secondary ganglion cyst formation in the  retinacular wall, status post failure to respond to nonoperative  measures including steroid injection, activity modification, and  splinting, with confirmation of the intraretinacular ganglion cyst and  early giant cell reaction.   OPERATION:  Release of right first dorsal compartment and inspection of  abductor pollicis longus and extensor pollicis brevis tendons.   SURGEON:  Katy Fitch. Sypher, M.D.   ASSISTANT:  Whitney [name], P.A.-C.   ANESTHESIA:  2% lidocaine field block and first dorsal compartment block  without supplemental intravenous sedation.   SUPERVISING ANESTHESIOLOGIST:  Janetta Hora. Gelene Mink, M.D.   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  During the past six months, she has had episodes of  pain at the right first dorsal compartment and recently has developed  marked swelling of the compartment and a reactive ganglion cyst/giant  cell reaction.  We have attempted nonoperative management including  injection with  Depo-Medrol and lidocaine into the first dorsal  compartment and splinting without relief.  Dr. Sherald Hess had reached  a stage in the pathologic process of stenosing tenosynovitis where she  was ready to proceed with release of the right first dorsal compartment.  After informed consent, she is brought to the operating room at this  time.   PROCEDURE:  Whitney [name] is brought to room 5 and placed in supine  position on the operating table.  After informed sent, she elected to  proceed with straight local anesthesia with a radial nerve field block  and block of the first dorsal compartment.  We recommended 2% lidocaine.  Her right arm was prepped with Betadine and a 2% lidocaine block of the  radial nerve proximal to the first dorsal compartment and an intrasheath  block was accomplished without complication.  After approximately ten  minutes, she had excellent anesthesia of the first dorsal compartment  and dorsum of the thumb.  A pneumatic tourniquet was applied to the  proximal right brachium followed by routine Betadine scrub and paint.  Sterile stockinette and impervious arthroscopy drapes were applied.  After exsanguination of the right hand and arm with an Esmarch bandage,  an arterial tourniquet on the proximal right brachium was inflated to  220 mmHg.   The procedure commenced with a short  transverse incision directly over  the palpably thickened first dorsal compartment.  The subcutaneous  tissues were carefully divided taking care to retract the cephalic vein  and the radial sensory branches.  A Glorious Peach was used to clear inflammatory  tissue off of the first dorsal compartment.  A ganglion/giant cell  reaction was noted measuring 5 mm in length and 4 mm in width.  This was  about 3 mm in height.  This was circumferentially dissected and removed  with a rongeur.  Care was taken to gently elevate the radial sensory  branches and place blunt retractors.  The compartment was  split at its  apex with a 15 scalpel blade and the incision extended proximally and  distally under direct vision with scissors.  An abundant tenosynovium  reaction was noted.  The inflammatory tenosynovium was debrided with a  rongeur and fine tenotomy scissors dissection.  There were two large  slips of the abductor pollicis longus noted and a smaller slip of the  extensor pollicis brevis noted.  There was no septum within the  compartment.   The wound was inspected for bleeding points and no significant bleeding  sites noted.  The wound was then repaired with intradermal 3-0 Prolene  and Steri-Strips.  The tourniquet was released with immediate capillary  refill to the fingers and thumb.  There was no bleeding at the wound  site.  Dr. Sherald Hess wrist was dressed with sterile gauze and a 3  inch Ace wrap.  There were no apparent complications.   We will encourage immediate active range of motion exercises.  She was  provided a small Tegaderm dressing and will remove her Ace wrap dressing  in three days and cover the wound with a Tegaderm.  This will facilitate  bathing and activities of daily living at home.  We will see Dr.  Sherald Hess back for follow-up in 7-9 days for removal of the Tegaderm,  wound inspection, and probable suture removal.  She will begin immediate  active range of motion exercises.      Katy Fitch Sypher, M.D.  Electronically Signed     RVS/MEDQ  D:  08/15/2006  T:  08/15/2006  Job:  [id number]   cc:   Brantley Persons, M.D.

## 2010-07-21 NOTE — Op Note (Signed)
NAMESATCHA, STORLIE           ACCOUNT NO.:  [id number]   MEDICAL RECORD NO.:  [id number]          PATIENT TYPE:  AMB   LOCATION:  ENDO                         FACILITY:  [facility]   PHYSICIAN:  Anselmo Rod, M.D.  DATE OF BIRTH:  15-Nov-1955   DATE OF PROCEDURE:  10/21/2004  DATE OF DISCHARGE:                                 OPERATIVE REPORT   PROCEDURE PERFORMED:  Colonoscopy with biopsies.   ENDOSCOPIST:  Anselmo Rod, MD.   INSTRUMENT USED:  Olympus video colonoscope.   INDICATIONS FOR PROCEDURE:  A 55 year old white female, with a history of  change in bowel habits and diarrhea for the last week and guaiac-positive  stools on physical examination, undergoing colonoscopy to rule out colonic  polyps, masses, colitis, etc.   PRE-PROCEDURE PREPARATION:  Informed consent was procured from the patient.  The patient was fasted for 8 hours prior to the procedure and prepped with a  bottle of MiraLax and Gatorade the night prior to the procedure.   PRE-PROCEDURE PHYSICAL:  VITAL SIGNS:  Stable.  NECK:  Supple.  CHEST:  Clear to auscultation.  S1 and S2 regular.  ABDOMEN:  Soft with normal bowel sounds.   DESCRIPTION OF PROCEDURE:  The patient was placed in the left lateral  decubitus position and sedated with an additional 15 mcg of Fentanyl and 4  mg of Versed in slow incremental doses.  Once the patient was adequately  sedated and maintained on low flow oxygen and continuous cardiac monitoring,  the Olympus video colonoscope was advanced from the rectum to the cecum with  slight difficulty.  The patient had some abdominal discomfort with  insufflation of air into the colon, questionable visceral hypersensitivity.  A small sessile polyp was biopsied from 50 cm [cold biopsies x1].  Random  colon biopsies were also done to rule out collagenous versus microscopic  colitis.  No exudates, erosions, ulcerations, masses, or polyps were seen.  There was no evidence of  diverticulosis.  Retroflexion in the rectum  revealed no abnormalities.  The patient tolerated the procedure well without  complications.  There was a large amount of liquid residual stool in the  colon, and multiple washings were done.   IMPRESSION:  1.Small sessile polyp biopsied from 50 cm.  2.Random colon biopsies done to rule out microscopic versus collagenous  colitis.  3.Significant amount of residual stool in the colon, multiple washings done,  very small lesions could be missed.   RECOMMENDATIONS:  1.Await pathology results.  2.Avoid nonsteroidals.  3.Further recommendations will be made once the pathology results have been  procured.  4.Outpatient followup as the need arises in the future.      Anselmo Rod, M.D.  Electronically Signed     JNM/MEDQ  D:  10/22/2004  T:  10/22/2004  Job:  [id number]   cc:   Leatha Gilding. Mezer, M.D.  [address]  [place]  [place] [address]  Fax: [phone]   Clabe Seal. Meryl Crutch, M.D.  [address]  [place]  [place] [address]  Fax: [phone]

## 2010-07-21 NOTE — Op Note (Signed)
Whitney [name], Whitney [name]           ACCOUNT NO.:  [id number]   MEDICAL RECORD NO.:  [id number]          PATIENT TYPE:  AMB   LOCATION:  ENDO                         FACILITY:  [facility]   PHYSICIAN:  Anselmo Rod, M.D.  DATE OF BIRTH:  September 25, 1955   DATE OF PROCEDURE:  10/21/2004  DATE OF DISCHARGE:                                 OPERATIVE REPORT   PROCEDURE PERFORMED:  Esophagogastroduodenoscopy with biopsies.   ENDOSCOPIST:  Anselmo Rod, MD.   INSTRUMENT USED:  Olympus video panendoscope.   INDICATIONS FOR PROCEDURE:  A 55 year old white female with a history of  guaiac-positive stools, change in bowel habits, and nonsteroidal use  undergoing an EGD to rule out peptic ulcer disease, esophagitis, etc.   PRE-PROCEDURE PREPARATION:  Informed consent was procured from the patient.  The patient was fasted for 8 hours prior to the procedure, and the risks and  benefits of the procedure were discussed with the patient in great detail.   PRE-PROCEDURE PHYSICAL EXAMINATION:  VITAL SIGNS:  Stable.  NECK:  Supple.  CHEST:  Clear to auscultation, S1 and S2 regular.  ABDOMEN:  Soft with normal bowel sounds.   DESCRIPTION OF PROCEDURE:  The patient was placed in the left lateral  decubitus position and sedated with 6 mg of Versed and 60 mcg of Fentanyl.  Once the patient was adequately sedated and maintained on low flow oxygen  and continuous cardiac monitoring, the Olympus video panendoscope was  advanced through the mouthpiece, over the tongue, and into the esophagus  under direct vision.  The entire esophagus appeared normal with no evidence  of ring, stricture, masses, esophagitis, or Barrett's mucosa.  The scope was  then advanced into the stomach.  There was diffuse gastritis noted  throughout the gastric mucosa with multiple antral erosions and severe  antral gastritis.  A small ulcer was seen along the greater curvature.  There was no evidence of a visible vessel, and biopsies  were done from the  antrum to rule out H. pylori by CLO test.  Small bowel biopsies were done to  rule out sprue.  The mucosa was friable, probably secondary to nonsteroidal  use in the past.  The patient tolerated the procedure well without  complications.  Retroflexion in the high cardia revealed no abnormalities.   IMPRESSION:  1.  Normal-appearing esophagus.  2.  Diffuse gastritis with severe antral gastritis and antral erosions.      Biopsies done to rule out the presence of H. pylori by CLO test.  3.  Small ulcer along greater curvature without evidence of visible vessel.  4.  Small bowel biopsies done to rule out sprue.   RECOMMENDATIONS:  1.  Await pathology results.  2.  Avoid nonsteroidals including aspirin for now.  3.  Proceed with a colonoscopy at this time.  Further recommendations to be      made thereafter.      Anselmo Rod, M.D.  Electronically Signed     JNM/MEDQ  D:  10/21/2004  T:  10/21/2004  Job:  [id number]   cc:   Leatha Gilding. Mezer, M.D.  [phone]  Jeanella Anton  [place] [address]  [place] [address]  Fax: [phone]   Clabe Seal. Meryl Crutch, M.D.  [address]  [place]  [place] [address]  Fax: [phone]

## 2010-07-21 NOTE — Op Note (Signed)
Whitney [name], Whitney [name]           ACCOUNT NO.:  [id number]   MEDICAL RECORD NO.:  [id number]          PATIENT TYPE:  AMB   LOCATION:  DSC                          FACILITY:  [facility]   PHYSICIAN:  Katy Fitch. Sypher, M.D. DATE OF BIRTH:  11-13-1955   DATE OF PROCEDURE:  DATE OF DISCHARGE:  04/06/2008                               OPERATIVE REPORT   PREOPERATIVE DIAGNOSES:  1. 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  2. Left thumb stenosing tenosynovitis at A1 pulley.   OPERATIONS:  1. Release of right thumb A1 pulley with limited synovectomy.  2. Release of left thumb A1 pulley.   OPERATIONS:  Katy Fitch. Sypher, MD   ASSISTANT:  Marveen Reeks Dasnoit, PA   ANESTHESIA:  Lidocaine 2% without supplemental sedation.   SUPERVISING ANESTHESIOLOGIST/ANESTHETIST:  Dr. Teressa Senter.   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   We have discussed this on various occasions and have treated her with  steroid injection with temporary success.  She now has significant  recurrent stenosing tenosynovitis of the right thumb and annoying  occasional tenosynovitis and triggering of the left thumb.  She  requested that we proceed with release of the right and left thumb A1  pulleys under local anesthesia at this time.   Preoperatively, she was advised the risks and benefits of surgery as  well as anesthesia.  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   After informed consent, she was brought to the operating room at this  time.   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   After Betadine prep of the right and left thumbs, 2% lidocaine was  infiltrated in the path of the intended incision and flexor  sheath of  the flexor pollicis longus.  After 10 minutes, excellent anesthesia was  achieved.  The right and left arms were prepped with Betadine soap  solution and sterilely draped.  A pneumatic tourniquet was applied to  the proximal brachium on the right and an Esmarch will be used on the  left.   The left hand and thumb were exsanguinated with the Esmarch bandage used  as an arterial tourniquet on the forearm.  A short transverse incision  was fashioned directly over the A1 pulley.  Subcutaneous tissues were  carefully divided taking care to gently retract the radial proper  digital nerve.   The A1 pulley was isolated, cleared of soft tissues with a Market researcher, and split with scalpel and scissors.  Tendons were delivered  and found to be uninjured.  Thereafter, full active range of motion of  the IP joint was recovered.  The wound was repaired with intradermal 4-0  Prolene suture and a Steri-Strip.  The tourniquet was released with  immediate capillary refill.  Attention was then directed to the right  thumb.  The right arm and hand were exsanguinated  with an Esmarch  bandage, and the arterial tourniquet on the proximal brachium inflated  to 220 mm mercury.  The procedure commenced with a short transverse  scission directly over the A1 pulley.  Subcutaneous issues were  carefully divided taking care to identify the radial proper digital  nerve and gently retracted the nerve.  The pulley was invested with  inflammatory tenosynovium.  This was cleared with the Therapist, nutritional.  The pulley was then split with scalpel and scissors.  Full excellent  range of motion of the IP joint was recovered.  There was some  degenerative change of the tendon due to chronic entrapment.  The wound  was inspected for bleeding points and subsequently repaired with  intradermal 4-0 Prolene.   A compressive dressing was applied with a Steri-Strip followed by  sterile gauze and Coban, right and left  thumb spica dressings.   For aftercare, Dr. Sherald Hess has a prescription of Vicodin.  We will  see her back for followup in approximately 4 days to change her  dressings to a Tegaderm dressing.      Katy Fitch Sypher, M.D.  Electronically Signed     RVS/MEDQ  D:  04/27/2008  T:  04/28/2008  Job:  [id number]

## 2011-03-20 ENCOUNTER — Emergency Department (INDEPENDENT_AMBULATORY_CARE_PROVIDER_SITE_OTHER)
Admission: EM | Admit: 2011-03-20 | Discharge: 2011-03-20 | Disposition: A | Discharge status: Home / Self Care | Payer: Self-pay | Source: Home / Self Care | Attending: Emergency Medicine | Admitting: Emergency Medicine

## 2011-03-20 ENCOUNTER — Encounter (HOSPITAL_COMMUNITY): Payer: Self-pay

## 2011-03-20 DIAGNOSIS — G43909 Migraine, unspecified, not intractable, without status migrainosus: Secondary | ICD-10-CM

## 2011-03-20 HISTORY — DX: Gastro-esophageal reflux disease without esophagitis: K21.9

## 2011-03-20 MED ORDER — HYDROMORPHONE HCL PF 2 MG/ML IJ SOLN
2.0000 mg | Freq: Once | INTRAMUSCULAR | Status: AC
  Administered 2011-03-20: 2 mg via INTRAVENOUS

## 2011-03-20 MED ORDER — HYDROMORPHONE HCL PF 1 MG/ML IJ SOLN
INTRAMUSCULAR | Status: AC
  Filled 2011-03-20: qty 2

## 2011-03-20 MED ORDER — ONDANSETRON 4 MG PO TBDP
ORAL_TABLET | ORAL | Status: AC
  Filled 2011-03-20: qty 2

## 2011-03-20 MED ORDER — METHYLPREDNISOLONE SODIUM SUCC 125 MG IJ SOLR
INTRAMUSCULAR | Status: AC
  Filled 2011-03-20: qty 2

## 2011-03-20 MED ORDER — OXYCODONE-ACETAMINOPHEN 5-325 MG PO TABS: ORAL_TABLET | ORAL | Status: AC

## 2011-03-20 MED ORDER — PREDNISONE 10 MG PO TABS: ORAL_TABLET | ORAL | Status: DC

## 2011-03-20 MED ORDER — METHYLPREDNISOLONE SODIUM SUCC 125 MG IJ SOLR
125.0000 mg | Freq: Once | INTRAMUSCULAR | Status: AC
  Administered 2011-03-20: 125 mg via INTRAVENOUS

## 2011-03-20 MED ORDER — ONDANSETRON 4 MG PO TBDP
8.0000 mg | ORAL_TABLET | Freq: Once | ORAL | Status: AC
  Administered 2011-03-20: 8 mg via ORAL

## 2011-03-20 MED ORDER — SODIUM CHLORIDE 0.9 % IV SOLN
INTRAVENOUS | Status: DC
  Administered 2011-03-20: 13:00:00 via INTRAVENOUS

## 2011-03-20 NOTE — ED Provider Notes (Signed)
Chief Complaint  Patient presents with  . 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 She describes generalized, severe, throbbing pain with nausea, vomiting, photophobia, and phonophobia. She denies any visual or neurological symptoms with the exception of some spots in her vision. She's had similar headaches for years. Usually she can control these with the Zomig, but she tried the Zomig and threw it right back up. She denies any use precipitating factor. She's not taking any meds for prevention. She is seeing a headache specialist in [place].  Review of Systems:  Other than noted above, the patient denies any of the following symptoms: Systemic:  No fever, chills, fatigue, photophobia, stiff neck. Eye:  No redness, eye pain, discharge, blurred vision, or diplopia. ENT:  No nasal congestion, rhinorrhea, sinus pressure or pain, sneezing, earache, or sore throat. Neuro:  No paresthesias, loss of consciousness, seizure activity, muscle weakness, trouble with coordination or gait, trouble speaking or swallowing. Psych:  No depression, anxiety or trouble sleeping.  PMFSH:  Past medical history, family history, social history, meds, and allergies were reviewed.  Physical Exam:   Vital signs:  BP 151/87  Pulse 77  Temp(Src) 97.7 F (36.5 C) (Oral)  Resp 20  SpO2 96% General:  Alert and oriented.  In no distress. Eye:  Lids and conjunctivas normal.  PERRL,  Full EOMs.  Fundi benign with normal discs and vessels. ENT:  No cranial or facial tenderness to palpation.  TMs and canals clear.  No intra oral lesions, pharynx clear, mucous membranes moist, dentition normal. Neck:  Supple, full ROM, no tenderness to palpation.  No adenopathy or mass. Neuro:  Alert and orented times 3.  Speech was clear, fluent, and appropriate.  Cranial nerves intact. No pronator drift, muscle strength normal.  Finger to nose normal.  DTRs 2+ . Psych:  Normal affect.  Medications given in UCC:  An IV was started with normal saline, of which she was given 500 mL's. She was given intravenously Dilaudid 2 mg IV and Solu-Medrol 125 mg IV and Zofran sublingual 8 mg. After she got all this she felt drowsy but her headache was much better and she felt like she could go home.  Assessment:   Diagnoses that have been ruled out:  None  Diagnoses that are still under consideration:  None  Final diagnoses:  Migraine    Plan:   1.  The following meds were prescribed:   New Prescriptions   OXYCODONE-ACETAMINOPHEN (PERCOCET) 5-325 MG PER TABLET    1 to 2 tablets every 6 hours as needed for pain.   PREDNISONE (DELTASONE) 10 MG TABLET    Take 4 tabs daily for 4 days, 3 tabs daily for 4 days, 2 tabs daily for 4 days, then 1 tab daily for 4 days.  Take all tabs at one time with food and preferably in the morning except for the first dose.   2.  The patient was instructed in symptomatic care and handouts were given. 3.  The patient was told to return if becoming worse in any way, if no better in 3 or 4 days, and given some red flag symptoms that would indicate earlier return.    Roque Lias, MD 03/20/11 [phone]

## 2011-03-20 NOTE — ED Notes (Signed)
C/o rt sided migraine headache that started around 6 am today.  REports she has frequent headaches but none this severe in approx 10 years.  States she took zomig around 830 am without relief, then took compazine for nausea.  Vomited x1 , c/o photophobia and seeing flashes of light.  Reports she had intermittent headaches yesterday.

## 2011-03-20 NOTE — ED Notes (Signed)
700/1000 ml of NaCl has infused;  Pt feeling much better but drowsy.  Denies pain or nausea.  Husband in [place] per his preference

## 2011-03-20 NOTE — ED Notes (Signed)
Pt looks like she feels much better, complexion is pink.  States pain is dull ache 1/10 now.

## 2011-03-28 ENCOUNTER — Encounter: Payer: Self-pay | Admitting: Adult Health

## 2011-03-29 ENCOUNTER — Encounter: Payer: Self-pay | Admitting: Adult Health

## 2011-03-29 ENCOUNTER — Ambulatory Visit (INDEPENDENT_AMBULATORY_CARE_PROVIDER_SITE_OTHER): Payer: PRIVATE HEALTH INSURANCE | Admitting: Adult Health

## 2011-03-29 DIAGNOSIS — J45991 Cough variant asthma: Secondary | ICD-10-CM

## 2011-03-29 NOTE — Patient Instructions (Signed)
QVAR 2 puffs Twice daily  -brush/rinse and gargle after use.  May stop QVAR after 2 nd inhaler if cough is resolved.  Zyrtec 10mg  At bedtime  As needed  Drainage  Saline nasal rinses As needed   ProAir HFA 2 puffs every 4-6 hr As needed  Wheezing  Please contact office for sooner follow up if symptoms do not improve or worsen or seek emergency care

## 2011-03-29 NOTE — Progress Notes (Signed)
Subjective:    Patient ID: Whitney [name], female    DOB: [birth year], 56 y.o.   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   October 19, 2008 3:47 PM  Pt is markedly better. The cough has resolved. There is no wheeze. Am PFR 450 and improves to 550 by evening.   Jul 08, 2009  Same symptoms as last summer. all went away and weaned off zyrtec , inhalers off since 8/10  symptoms return in 2/11. more pndrip, ent suggested . flonase and astelin plus zyrtec drainage better until mid march, then strep throat and ent rx cleocin for 10days  then ok until one week cough came back. seal like barky cough and still pndrip  had stopped all the nasal meds and zyrtec 3weeks ago.  then went back on all three allergy meds, then this week tuesday worse with talking  resumed qvar, and proair as needed.  not much better for two days  did get pred and took 40mg  last night Jul 19, 2009 1:41 PM  Not as bad with cough, now only occasionally, muocus is clear, still with pn drip  no heart burn. not dyspneic  tramadol helped. and did not hurt much  the pt is not hoarse  overall is much improved >QVAR stoppped    03/29/2011 Acute OV  Complains of  cough for 1.5week.  Had  recent sinusitis with levaquin and pred dose pak, flonase - that resolved.   Worse with talking.  Not taking QVAR. Weaned off after last visit. Slight drip but much better. Main issue is dry tickling cough-that is aggravating. Feels fine. No fever or discolored mucus . No chest pain or hemotpysis  Started delsym with some help.    Review of Systems Constitutional:   No  weight loss, night sweats,  Fevers, chills, fatigue, or  lassitude.  HEENT:   No headaches,  Difficulty swallowing,  Tooth/dental problems, or  Sore throat,                No sneezing, itching, ear ache, nasal congestion, post nasal drip,   CV:  No chest  pain,  Orthopnea, PND, swelling in lower extremities, anasarca, dizziness, palpitations, syncope.   GI  No heartburn, indigestion, abdominal pain, nausea, vomiting, diarrhea, change in bowel habits, loss of appetite, bloody stools.   Resp:    No coughing up of blood.  No change in color of mucus.  No wheezing.  No chest wall deformity  Skin: no rash or lesions.  GU: no dysuria, change in color of urine, no urgency or frequency.  No flank pain, no hematuria   MS:  No joint pain or swelling.  No decreased range of motion.  No back pain.  Psych:  No change in mood or affect. No depression or anxiety.  No memory loss.          Objective:   Physical Exam GEN: A/Ox3; pleasant , NAD, well nourished   HEENT:  [place]/AT,  EACs-clear, TMs-wnl, NOSE-clear, THROAT-clear, no lesions, no postnasal drip or exudate noted.   NECK:  Supple w/ fair ROM; no JVD; normal carotid impulses w/o bruits; no thyromegaly or nodules palpated; no lymphadenopathy.  RESP  Clear  P & A; w/o, wheezes/ rales/ or rhonchi.no accessory muscle use, no dullness to percussion  CARD:  RRR, no m/r/g  , no peripheral edema, pulses intact, no cyanosis or  clubbing.  GI:   Soft & nt; nml bowel sounds; no organomegaly or masses detected.  Musco: Warm bil, no deformities or joint swelling noted.   Neuro: alert, no focal deficits noted.    Skin: Warm, no lesions or rashes         Assessment & Plan:

## 2011-03-29 NOTE — Assessment & Plan Note (Signed)
Flare triggered by recent sinusitis   Plan:  QVAR 2 puffs Twice daily  -brush/rinse and gargle after use.  May stop QVAR after 2 nd inhaler if cough is resolved.  Zyrtec 10mg  At bedtime  As needed  Drainage  Saline nasal rinses As needed   ProAir HFA 2 puffs every 4-6 hr As needed  Wheezing  Please contact office for sooner follow up if symptoms do not improve or worsen or seek emergency care

## 2011-03-30 ENCOUNTER — Telehealth: Payer: Self-pay | Admitting: Adult Health

## 2011-03-30 ENCOUNTER — Other Ambulatory Visit: Payer: Self-pay | Admitting: *Deleted

## 2011-03-30 MED ORDER — ALBUTEROL SULFATE HFA 108 (90 BASE) MCG/ACT IN AERS: INHALATION_SPRAY | RESPIRATORY_TRACT | Status: DC

## 2011-03-30 MED ORDER — BECLOMETHASONE DIPROPIONATE 40 MCG/ACT IN AERS: 2.0000 | INHALATION_SPRAY | Freq: Two times a day (BID) | RESPIRATORY_TRACT | Status: DC

## 2011-04-06 ENCOUNTER — Telehealth: Payer: Self-pay | Admitting: Critical Care Medicine

## 2011-04-06 NOTE — Telephone Encounter (Signed)
Pt has returned triage's call & can be reached at [phone] until 5:00-5:30 tonight.  Antionette Fairy

## 2011-04-06 NOTE — Telephone Encounter (Signed)
[email or web address]  

## 2011-04-09 MED ORDER — HYDROCODONE-HOMATROPINE 5-1.5 MG/5ML PO SYRP: 5.0000 mL | ORAL_SOLUTION | Freq: Four times a day (QID) | ORAL | Status: AC | PRN

## 2011-04-09 NOTE — Telephone Encounter (Signed)
Made OV with PW tomorrow.  Hydromet # 8oz 1-2 tsp every 4-6 hr As needed  Cough -may make you sleepy  No refills  Please contact office for sooner follow up if symptoms do not improve or worsen or seek emergency care  Pt aware rx sent to pharm.

## 2011-04-09 NOTE — Telephone Encounter (Signed)
Pt returning call says she needs to talk to someone today can be reached at [phone].Whitney [name]

## 2011-04-09 NOTE — Telephone Encounter (Signed)
Patient states she is still using the Qvar, Proair and Zyrtec but her cough is no better. She has a tickle and hacky cough all day long. She has tried using throat lozenges and this helps slightly. She is requesting additional recs to try and resolve this matter. PW is out of the office and will forward msg to TP since she saw the pt last. Pls advise. Allergies  Allergen Reactions  . Sulfonamide Derivatives     REACTION: rash

## 2011-04-09 NOTE — Telephone Encounter (Signed)
RX called to [facility] on [address].

## 2011-04-09 NOTE — Telephone Encounter (Signed)
Returning call can be reached at [phone].Whitney [name]

## 2011-04-09 NOTE — Telephone Encounter (Signed)
LM with co-worker to have pt return triage call.

## 2011-04-10 ENCOUNTER — Encounter: Payer: Self-pay | Admitting: Critical Care Medicine

## 2011-04-10 ENCOUNTER — Ambulatory Visit (INDEPENDENT_AMBULATORY_CARE_PROVIDER_SITE_OTHER): Payer: PRIVATE HEALTH INSURANCE | Admitting: Critical Care Medicine

## 2011-04-10 DIAGNOSIS — J45991 Cough variant asthma: Secondary | ICD-10-CM

## 2011-04-10 MED ORDER — FLUTICASONE PROPIONATE 50 MCG/ACT NA SUSP: 2.0000 | Freq: Every day | NASAL | Status: DC

## 2011-04-10 MED ORDER — BENZONATATE 100 MG PO CAPS: ORAL_CAPSULE | ORAL | Status: AC

## 2011-04-10 MED ORDER — CHLORPHENIRAMINE MALEATE CR 8 MG PO CPCR: 8.0000 mg | ORAL_CAPSULE | Freq: Every day | ORAL | Status: AC

## 2011-04-10 NOTE — Patient Instructions (Addendum)
Stop Qvar Stop all cough drops Start chlormetron (chlorpheniramine) 8mg  at bedtime Stay on Flonase daily Start cough protocol with Hydromet/Delsym/Benzonatate Use sugar free candy lozenges in throat at all times Use saline rinse twice daily for 7days Follow a strict reflux diet as best as possible Return two weeks

## 2011-04-10 NOTE — Progress Notes (Signed)
Subjective:    Patient ID: Whitney [name], female    DOB: 11/07/1955, 56 y.o.   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   October 19, 2008 3:47 PM  Pt is markedly better. The cough has resolved. There is no wheeze. Am PFR 450 and improves to 550 by evening.   Jul 08, 2009  Same symptoms as last summer. all went away and weaned off zyrtec , inhalers off since 8/10  symptoms return in 2/11. more pndrip, ent suggested . flonase and astelin plus zyrtec drainage better until mid march, then strep throat and ent rx cleocin for 10days  then ok until one week cough came back. seal like barky cough and still pndrip  had stopped all the nasal meds and zyrtec 3weeks ago.  then went back on all three allergy meds, then this week tuesday worse with talking  resumed qvar, and proair as needed.  not much better for two days  did get pred and took 40mg  last night Jul 19, 2009 1:41 PM  Not as bad with cough, now only occasionally, muocus is clear, still with pn drip  no heart burn. not dyspneic  tramadol helped. and did not hurt much  the pt is not hoarse  overall is much improved >QVAR stoppped    1/24 Acute OV  Complains of  cough for 1.5week.  Had  recent sinusitis with levaquin and pred dose pak, flonase - that resolved.   Worse with talking.  Not taking QVAR. Weaned off after last visit. Slight drip but much better. Main issue is dry tickling cough-that is aggravating. Feels fine. No fever or discolored mucus . No chest pain or hemotpysis  Started delsym with some help.   04/10/2011 Seen by NP 1/24 and Rx was : QVAR 2 puffs Twice daily -brush/rinse and gargle after use.  May stop QVAR after 2 nd inhaler if cough is resolved.  Zyrtec 10mg  At bedtime As needed Drainage  Saline nasal rinses As needed  ProAir HFA 2 puffs every 4-6 hr As needed Wheezing  This did not  help and hydromet called in 04/09/11  Not seen since 3/11.  Had a sinusitis and Rx medrol dosepak and levaquin and nasal spray.  12/12.   This helped but then cough started in January.   As talks gets worse.  Notes sl hoarse.  Sl pndrip.  No nasal d/c. Cough is dry .   No heartburn/belching or burping.  Gastritis in the past.  On PPI daily.  No soreness in the throat.   Review of Systems  Constitutional:   No  weight loss, night sweats,  Fevers, chills, fatigue, or  lassitude.  HEENT:   No headaches,  Difficulty swallowing,  Tooth/dental problems, or  Sore throat,                No sneezing, itching, ear ache, nasal congestion, post nasal drip,   CV:  No chest pain,  Orthopnea, PND, swelling in lower extremities, anasarca, dizziness, palpitations, syncope.   GI  No heartburn, indigestion, abdominal pain, nausea, vomiting, diarrhea, change in bowel habits, loss of appetite, bloody stools.   Resp:    No coughing up of blood.  No change in color of mucus.  No wheezing.  No chest wall deformity  Skin: no rash or lesions.  GU: no dysuria, change in color of urine, no  urgency or frequency.  No flank pain, no hematuria   MS:  No joint pain or swelling.  No decreased range of motion.  No back pain.  Psych:  No change in mood or affect. No depression or anxiety.  No memory loss.          Objective:   Physical Exam  Filed Vitals:   04/10/11 1619  BP: 126/84  Pulse: 81  Temp: 99 F (37.2 C)  TempSrc: Oral  Height: 5\' 6"  (1.676 m)  Weight: 148 lb 6.4 oz (67.314 kg)  SpO2: 97%    Gen: Pleasant, well-nourished, in no distress,  normal affect  ENT: No lesions,  mouth clear,  oropharynx clear, +++ postnasal drip, rhinitis bilateral  Neck: No JVD, no TMG, no carotid bruits  Lungs: No use of accessory muscles, no dullness to percussion,pseudowheeze  Cardiovascular: RRR, heart sounds normal, no murmur or gallops, no peripheral edema  Abdomen: soft and NT, no HSM,  BS  normal  Musculoskeletal: No deformities, no cyanosis or clubbing  Neuro: alert, non focal  Skin: Warm, no lesions or rashes  No results found.         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   Plan Stop Qvar>>irriates upper airway in this setting Stop all cough drops Start chlormetron (chlorpheniramine) 8mg  at bedtime Stay on Flonase daily Start cough protocol with Hydromet/Delsym/Benzonatate Use sugar free candy lozenges in throat at all times Use saline rinse twice daily for 7days Follow a strict reflux diet as best as possible Return two weeks     Updated Medication List Outpatient Encounter Prescriptions as of 04/10/2011  Medication Sig Dispense Refill  . albuterol (PROAIR HFA) 108 (90 BASE) MCG/ACT inhaler 2 puffs every 4-6 hrs as needed  1 Inhaler  3  . AXERT 12.5 MG tablet Take 1 tablet by mouth as needed. For migraines      . cetirizine (ZYRTEC) 10 MG tablet Take 10 mg by mouth daily.      . fluticasone (FLONASE) 50 MCG/ACT nasal spray Place 2 sprays into the nose daily.  16 g  6  . Orphenadrine-Aspirin-Caffeine (NORGESIC FORTE PO) Take by mouth as needed.      . pantoprazole (PROTONIX) 40 MG tablet Take 40 mg by mouth daily.      [name] zolmitriptan (ZOMIG-ZMT) 5 MG disintegrating tablet Take 5 mg by mouth as needed.      [name] DISCONTD: beclomethasone (QVAR) 40 MCG/ACT inhaler Inhale 2 puffs into the lungs 2 (two) times daily.  1 Inhaler  3  . DISCONTD: fluticasone (FLONASE) 50 MCG/ACT nasal spray Place 2 sprays into the nose daily.      . benzonatate (TESSALON) 100 MG capsule Take 1-2 every 4 - 6 hours per cough protocol  90 capsule  4  . chlorpheniramine (CHLOR-TRIMETON) 8 MG capsule Take 1 capsule (8 mg total) by mouth at bedtime.  30  capsule  6  . HYDROcodone-homatropine (HYCODAN) 5-1.5 MG/5ML syrup Take 5 mLs by mouth every 6 (six) hours as needed for cough.  240 mL  0  . propranolol (INDERAL) 20 MG tablet Take 1 tablet by mouth Twice daily.      [name] DISCONTD: albuterol (PROVENTIL HFA;VENTOLIN HFA) 108 (90 BASE) MCG/ACT inhaler 1-2 puffs every  4-6 hours as needed      . DISCONTD: azelastine (ASTELIN) 137 MCG/SPRAY nasal spray Place 2 sprays into the nose 2 (two) times daily. Use in each nostril as directed      . DISCONTD: predniSONE (DELTASONE) 10 MG tablet Take 4 tabs daily for 4 days, 3 tabs daily for 4 days, 2 tabs daily for 4 days, then 1 tab daily for 4 days.  Take all tabs at one time with food and preferably in the morning except for the first dose.  40 tablet  0  . DISCONTD: Prochlorperazine Maleate (COMPAZINE PO) Take by mouth.      . DISCONTD: topiramate (TOPAMAX) 25 MG tablet 3 tabs at bedtime as needed      . DISCONTD: traMADol (ULTRAM) 50 MG tablet 1-2 by mouth every 4-6 hours as needed for cough      . DISCONTD: valACYclovir (VALTREX) 1000 MG tablet Take 1,000 mg by mouth 3 (three) times daily.      [name] DISCONTD: Vitamin D, Ergocalciferol, (DRISDOL) 50000 UNITS CAPS Take 1 capsule by mouth once a week.

## 2011-04-11 NOTE — Assessment & Plan Note (Signed)
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   Plan Stop Qvar>>irriates upper airway in this setting Stop all cough drops Start chlormetron (chlorpheniramine) 8mg  at bedtime Stay on Flonase daily Start cough protocol with Hydromet/Delsym/Benzonatate Use sugar free candy lozenges in throat at all times Use saline rinse twice daily for 7days Follow a strict reflux diet as best as possible Return two weeks

## 2011-05-01 ENCOUNTER — Ambulatory Visit: Payer: PRIVATE HEALTH INSURANCE | Admitting: Critical Care Medicine

## 2011-05-03 ENCOUNTER — Encounter: Payer: Self-pay | Admitting: Critical Care Medicine

## 2011-05-03 ENCOUNTER — Ambulatory Visit (INDEPENDENT_AMBULATORY_CARE_PROVIDER_SITE_OTHER): Payer: PRIVATE HEALTH INSURANCE | Admitting: Critical Care Medicine

## 2011-05-03 DIAGNOSIS — J45991 Cough variant asthma: Secondary | ICD-10-CM

## 2011-05-03 NOTE — Progress Notes (Signed)
Subjective:    Patient ID: Whitney [name], female    DOB: 05-Feb-1956, 56 y.o.   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   2/5/13Seen by NP 1/24 and Rx was : QVAR 2 puffs Twice daily -brush/rinse and gargle after use.  May stop QVAR after 2 nd inhaler if cough is resolved.  Zyrtec 10mg  At bedtime As needed Drainage  Saline nasal rinses As needed  ProAir HFA 2 puffs every 4-6 hr As needed Wheezing  This did not help and hydromet called in 04/09/11  Not seen since 3/11.  Had a sinusitis and Rx medrol dosepak and levaquin and nasal spray.  12/12.   This helped but then cough started in January.   As talks gets worse.  Notes sl hoarse.  Sl pndrip.  No nasal d/c. Cough is dry .   No heartburn/belching or burping.  Gastritis in the past.  On PPI daily.  No soreness in the throat.   2/28 At last ov we rec Stop Qvar>>irriates upper airway in this setting Stop all cough drops Start chlormetron (chlorpheniramine) 8mg  at bedtime Stay on Flonase daily Start cough protocol with Hydromet/Delsym/Benzonatate Use sugar free candy lozenges in throat at all times Use saline rinse twice daily for 7days Follow a strict reflux diet as best as possible  Cough is better,  This past weekend more productive cough,  Sl pn drip.   No wheeze.  No dyspneic.    Clear mucus, no yellow.    Review of Systems  Constitutional:   No  weight loss, night sweats,  Fevers, chills, fatigue, or  lassitude.  HEENT:   No headaches,  Difficulty swallowing,  Tooth/dental problems, or  Sore throat,                No sneezing, itching, ear ache, nasal congestion, post nasal drip,   CV:  No chest pain,  Orthopnea, PND, swelling in lower extremities, anasarca, dizziness, palpitations, syncope.   GI  No heartburn, indigestion, abdominal pain, nausea, vomiting, diarrhea, change in bowel habits, loss  of appetite, bloody stools.   Resp:    No coughing up of blood.  No change in color of mucus.  No wheezing.  No chest wall deformity  Skin: no rash or lesions.  GU: no dysuria, change in color of urine, no urgency or frequency.  No flank pain, no hematuria   MS:  No joint pain or swelling.  No decreased range of motion.  No back pain.  Psych:  No change in mood or affect. No depression or anxiety.  No memory loss.          Objective:   Physical Exam  Filed Vitals:   05/03/11 1401  BP: 120/78  Pulse: 79  Temp: 98.3 F (36.8 C)  TempSrc: Oral  Height: 5\' 6"  (1.676 m)  Weight: 155 lb (70.308 kg)  SpO2: 97%    Gen: Pleasant, well-nourished, in no distress,  normal affect  ENT: No lesions,  mouth clear,  oropharynx clear, no  postnasal drip, less  rhinitis bilateral  Neck: No JVD, no TMG, no carotid bruits  Lungs: No use of accessory muscles, no dullness to percussion,pseudowheeze resolved   Cardiovascular: RRR, heart sounds normal, no murmur or gallops, no peripheral edema  Abdomen: soft and NT, no HSM,  BS normal  Musculoskeletal: No deformities, no cyanosis or clubbing  Neuro: alert, non focal  Skin: Warm, no lesions or rashes  No results found.         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  All improved   Plan Stay off qvar Use chlorpheniramine for 2 more weeks then may stop Use flonase and zyrtec as before Reflux diet and protonix as before Hycodan cough syrup if cough severe Return as needed       Updated Medication List Outpatient Encounter Prescriptions as of 05/03/2011  Medication Sig Dispense Refill  . albuterol (PROAIR HFA) 108 (90 BASE) MCG/ACT inhaler 2 puffs every 4-6 hrs as needed  1 Inhaler  3  . AXERT 12.5 MG tablet Take 1 tablet by mouth as needed. For migraines      .  benzonatate (TESSALON) 100 MG capsule as needed.      . cetirizine (ZYRTEC) 10 MG tablet Take 10 mg by mouth daily.      . fluticasone (FLONASE) 50 MCG/ACT nasal spray Place 2 sprays into the nose daily.  16 g  6  . HYDROcodone-homatropine (HYCODAN) 5-1.5 MG/5ML syrup as needed.      . Orphenadrine-Aspirin-Caffeine (NORGESIC FORTE PO) Take by mouth as needed.      . pantoprazole (PROTONIX) 40 MG tablet Take 40 mg by mouth daily.      [name] zolmitriptan (ZOMIG-ZMT) 5 MG disintegrating tablet Take 5 mg by mouth as needed.      [name] DISCONTD: propranolol (INDERAL) 20 MG tablet Take 1 tablet by mouth Twice daily.

## 2011-05-03 NOTE — Patient Instructions (Signed)
Stay off qvar Use chlorpheniramine for 2 more weeks then may stop Use flonase and zyrtec as before Reflux diet and protonix as before Hycodan cough syrup if cough severe Return as needed

## 2011-05-04 NOTE — Assessment & Plan Note (Addendum)
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  All improved   Plan Stay off qvar Use chlorpheniramine for 2 more weeks then may stop Use flonase and zyrtec as before Reflux diet and protonix as before Hycodan cough syrup if cough severe Return as needed

## 2011-05-21 ENCOUNTER — Other Ambulatory Visit: Payer: Self-pay | Admitting: Obstetrics and Gynecology

## 2011-10-15 ENCOUNTER — Other Ambulatory Visit: Payer: Self-pay | Admitting: Obstetrics and Gynecology

## 2011-10-15 DIAGNOSIS — Z1231 Encounter for screening mammogram for malignant neoplasm of breast: Secondary | ICD-10-CM

## 2011-10-23 ENCOUNTER — Ambulatory Visit
Admission: RE | Admit: 2011-10-23 | Discharge: 2011-10-23 | Disposition: A | Discharge status: Home / Self Care | Payer: PRIVATE HEALTH INSURANCE | Source: Ambulatory Visit | Attending: Obstetrics and Gynecology | Admitting: Obstetrics and Gynecology

## 2011-10-23 DIAGNOSIS — Z1231 Encounter for screening mammogram for malignant neoplasm of breast: Secondary | ICD-10-CM

## 2012-01-02 ENCOUNTER — Telehealth: Payer: Self-pay | Admitting: Internal Medicine

## 2012-01-02 MED ORDER — TRAMADOL HCL 50 MG PO TABS: 50.0000 mg | ORAL_TABLET | Freq: Three times a day (TID) | ORAL | Status: DC | PRN

## 2012-01-02 MED ORDER — IBUPROFEN 800 MG PO TABS: 800.0000 mg | ORAL_TABLET | Freq: Three times a day (TID) | ORAL | Status: DC | PRN

## 2012-01-02 NOTE — Telephone Encounter (Signed)
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 She has had no swelling, reports that she can dorsiflex, plantarflex and both evert and invert the ankle. She is having a fair amount of pain that is in part relieved with ibuprofen 800 mg. She is requesting advice and additional anlagesic.  Plan Continue with elastic bandage for support  For decreased ROM or swelling - ortho consult  For pain ibuprofen 800 mg - GI precautions, continue       Protonix; tramadol 50 mg 1-2 q8

## 2012-04-25 ENCOUNTER — Telehealth: Payer: Self-pay | Admitting: Critical Care Medicine

## 2012-04-25 MED ORDER — HYDROCODONE-HOMATROPINE 5-1.5 MG/5ML PO SYRP: 5.0000 mL | ORAL_SOLUTION | Freq: Four times a day (QID) | ORAL | Status: DC | PRN

## 2012-04-25 MED ORDER — ALBUTEROL SULFATE HFA 108 (90 BASE) MCG/ACT IN AERS: INHALATION_SPRAY | RESPIRATORY_TRACT | Status: DC

## 2012-04-25 MED ORDER — TRAMADOL HCL 50 MG PO TABS: 50.0000 mg | ORAL_TABLET | Freq: Three times a day (TID) | ORAL | Status: DC | PRN

## 2012-04-25 NOTE — Telephone Encounter (Signed)
Spoke with pt She states that her cough started back about 10 days ago Dry in nature She thinks that this is residual cough from PND- recent txed for a sinus infection Would like refills on proair, tramadol, and hycodan  Please advise if okay thanks

## 2012-04-25 NOTE — Telephone Encounter (Signed)
Spoke with pt and notified of recs per PW She verbalized understanding and denied any questions  I faxed her the cyclical cough protocol Rxs were called to pharm

## 2012-04-25 NOTE — Telephone Encounter (Signed)
Ok for refills,  Also send her the cyclic cough protocol OV if needs guidance further

## 2012-05-02 ENCOUNTER — Telehealth: Payer: Self-pay | Admitting: Critical Care Medicine

## 2012-05-02 MED ORDER — HYDROCODONE-HOMATROPINE 5-1.5 MG/5ML PO SYRP: 5.0000 mL | ORAL_SOLUTION | Freq: Four times a day (QID) | ORAL | Status: DC | PRN

## 2012-05-02 NOTE — Telephone Encounter (Signed)
Ok x one only then ov if not resolved

## 2012-05-02 NOTE — Telephone Encounter (Signed)
Pt notified that refill was sent to pharmacy per Dr Sherene Sires.  Pt will call if symptoms do not resolve.

## 2012-05-02 NOTE — Telephone Encounter (Signed)
hycodan was refilled 04/25/12 #120 ML. I spoke with pt and she stated she has been taking this w. The ramadol and it has helped with her cough. She is asking for 1 more refill. She is taking it mostly at night. Since PW is scheduled off will forward to [facility] OF THE day. Please advise MW. Thanks  Allergies  Allergen Reactions  . Sulfonamide Derivatives     REACTION: rash

## 2012-05-12 ENCOUNTER — Telehealth: Payer: Self-pay | Admitting: Critical Care Medicine

## 2012-05-12 NOTE — Telephone Encounter (Signed)
I spoke with pt and she is scheduled to come in tomorrow at 11:30 per pt request.

## 2012-05-12 NOTE — Telephone Encounter (Signed)
Yes , absolutely work her in!

## 2012-05-12 NOTE — Telephone Encounter (Signed)
Spoke with patient, patient would like to come in to see Dr. Delford Field tomorrow 05/13/12 at the [facility] office. Patient states she has been having a cough that had gotten better but then within the last week has worsened.  Dr. Delford Field is it ok to work patient into your schedule tomorrow? Please advise, thank you!

## 2012-05-13 ENCOUNTER — Encounter: Payer: Self-pay | Admitting: Critical Care Medicine

## 2012-05-13 ENCOUNTER — Ambulatory Visit (INDEPENDENT_AMBULATORY_CARE_PROVIDER_SITE_OTHER): Payer: Self-pay | Admitting: Critical Care Medicine

## 2012-05-13 VITALS — BP 134/78 | HR 82 | Temp 98.6°F | Ht 66.0 in | Wt 143.4 lb

## 2012-05-13 DIAGNOSIS — J45991 Cough variant asthma: Secondary | ICD-10-CM

## 2012-05-13 DIAGNOSIS — J019 Acute sinusitis, unspecified: Secondary | ICD-10-CM

## 2012-05-13 MED ORDER — FLUTICASONE PROPIONATE 50 MCG/ACT NA SUSP: 2.0000 | Freq: Every day | NASAL | Status: DC

## 2012-05-13 MED ORDER — HYDROCOD POLST-CPM POLST ER 10-8 MG PO CP12: 1.0000 | ORAL_CAPSULE | Freq: Two times a day (BID) | ORAL | Status: DC | PRN

## 2012-05-13 MED ORDER — BENZONATATE 100 MG PO CAPS: ORAL_CAPSULE | ORAL | Status: DC

## 2012-05-13 MED ORDER — PREDNISONE 10 MG PO TABS: ORAL_TABLET | ORAL | Status: DC

## 2012-05-13 MED ORDER — AMOXICILLIN-POT CLAVULANATE 875-125 MG PO TABS: 1.0000 | ORAL_TABLET | Freq: Two times a day (BID) | ORAL | Status: DC

## 2012-05-13 NOTE — Patient Instructions (Addendum)
Stop atrovent nasal spray Start Flonase two puff each nostril daily Start cyclic cough protocol with Tussicaps and tessalon (benzonatate) Prednisone 10mg  Take 4 for two days three for two days two for two days one for two days Augmentin one twice daily for 7 days STay on Qvar Stay on protonix Follow STRICT Reflux diet for at least 10days Return 1 month if unimproving or sooner

## 2012-05-13 NOTE — Assessment & Plan Note (Signed)
Cyclical cough with associated upper and lower airway inflammation and upper airway instability syndrome The triggers in the past have included sinusitis, postnasal drip syndrome, and reflux disease. The patient also has cough variant asthma with lower airway inflammation. Plan Stop atrovent nasal spray Start Flonase two puff each nostril daily Start cyclic cough protocol with Tussicaps and tessalon (benzonatate) Prednisone 10mg  Take 4 for two days three for two days two for two days one for two days Augmentin one twice daily for 7 days STay on Qvar Stay on protonix Follow STRICT Reflux diet for at least 10days Return 1 month if unimproving or sooner

## 2012-05-13 NOTE — Progress Notes (Signed)
Subjective:    Patient ID: Whitney [name], female    DOB: 1956-02-21, 57 y.o.   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   05/13/2012 Last seen 04/2011 Cough now x 3 weeks.  Pt had sinusitis 2/14: Rx amoxcil per ENT. 10days. Teoh.  Now uses  Did have atrovent some per nasal. No other nasal sprays.   Pt denies any dyspnea. Pt feels tight in the chest area. Worse when talking, cold air was a ppt factor Dry cough.  Notes some pndrip.  Can breath through the nose ok.  No heartburn. PPI is being used for years.  No belching/burping or dysphagia Sugar free candy is used  Hydrocodone helps, Delsym helps some.  Out of tessalon     Review of Systems  Constitutional:   No  weight loss, night sweats,  Fevers, chills, fatigue, or  lassitude.  HEENT:   No headaches,  Difficulty swallowing,  Tooth/dental problems, or  Sore throat,                No sneezing, itching, ear ache, nasal congestion, post nasal drip,   CV:  No chest pain,  Orthopnea, PND, swelling in lower extremities, anasarca, dizziness, palpitations, syncope.   GI  No heartburn, indigestion, abdominal pain, nausea, vomiting, diarrhea, change in bowel habits, loss of appetite, bloody stools.   Resp:    No coughing up of blood.  No change in color of mucus.  No wheezing.  No chest wall deformity  Skin: no rash or lesions.  GU: no dysuria, change in color of urine, no urgency or frequency.  No flank pain, no hematuria   MS:  No joint pain or swelling.  No decreased range of motion.  No back pain.  Psych:  No change in mood or affect. No depression or anxiety.  No memory loss.          Objective:   Physical Exam  Filed Vitals:   05/13/12 1150  BP: 134/78  Pulse: 82  Temp: 98.6 F (37 C)  TempSrc: Oral  Height: 5\' 6"  (1.676 m)  Weight: 143 lb 6.4 oz (65.046 kg)  SpO2: 95%    Gen: Pleasant,  well-nourished, in no distress,  normal affect  ENT: No lesions,  mouth clear,  oropharynx clear, no  postnasal drip,  Bilateral nasal purulence  Neck: No JVD, no TMG, no carotid bruits  Lungs: No use of accessory muscles, no dullness to percussion,mild pseudowheeze   Cardiovascular: RRR, heart sounds normal, no murmur or gallops, no peripheral edema  Abdomen: soft and NT, no HSM,  BS normal  Musculoskeletal: No deformities, no cyanosis or clubbing  Neuro: alert, non focal  Skin: Warm, no lesions or rashes  No results found.     Assessment & Plan:   Cough variant asthma Cyclical cough with associated upper and lower airway inflammation and upper airway instability syndrome The triggers in the past have included sinusitis, postnasal drip syndrome, and reflux disease. The patient also has cough variant asthma with lower airway inflammation. Plan Stop atrovent nasal spray Start Flonase two puff each nostril daily Start cyclic cough protocol with Tussicaps and tessalon (benzonatate) Prednisone 10mg  Take 4 for two days three for two days two for two days one for two days Augmentin one twice daily for 7 days STay on Qvar Stay on protonix Follow STRICT Reflux diet for at least  10days Return 1 month if unimproving or sooner    Updated Medication List Outpatient Encounter Prescriptions as of 05/13/2012  Medication Sig Dispense Refill  . albuterol (PROAIR HFA) 108 (90 BASE) MCG/ACT inhaler 2 puffs every 4-6 hrs as needed  1 Inhaler  1  . AXERT 12.5 MG tablet Take 1 tablet by mouth as needed. For migraines      . beclomethasone (QVAR) 40 MCG/ACT inhaler Inhale 2 puffs into the lungs 2 (two) times daily.      [name] CALCIUM-VITAMIN D PO Take 1 tablet by mouth daily.      [name] ibuprofen (ADVIL,MOTRIN) 800 MG tablet Take 1 tablet (800 mg total) by mouth every 8 (eight) hours as needed for pain.  100 tablet  0  . LUTEIN PO Take 1 capsule by mouth daily.      . pantoprazole (PROTONIX) 40 MG  tablet Take 40 mg by mouth daily.      . traMADol (ULTRAM) 50 MG tablet Take 1 tablet (50 mg total) by mouth every 8 (eight) hours as needed.  30 tablet  0  . zolmitriptan (ZOMIG-ZMT) 5 MG disintegrating tablet Take 5 mg by mouth as needed.      . [DISCONTINUED] fluticasone (FLONASE) 50 MCG/ACT nasal spray Place 2 sprays into the nose daily.  16 g  6  . amoxicillin-clavulanate (AUGMENTIN) 875-125 MG per tablet Take 1 tablet by mouth 2 (two) times daily.  14 tablet  0  . benzonatate (TESSALON) 100 MG capsule Take 1-2 every 4 hours per cough protocol  60 capsule  6  . fluticasone (FLONASE) 50 MCG/ACT nasal spray Place 2 sprays into the nose daily.  16 g  6  . Hydrocod Polst-Chlorphen Polst (TUSSICAPS) 10-8 MG CP12 Take 1 capsule by mouth 2 (two) times daily as needed.  30 each  4  . predniSONE (DELTASONE) 10 MG tablet Take 4 for two days three for two days two for two days one for two days  20 tablet  0  . [DISCONTINUED] benzonatate (TESSALON) 100 MG capsule as needed.      . [DISCONTINUED] cetirizine (ZYRTEC) 10 MG tablet Take 10 mg by mouth daily.      . [DISCONTINUED] fluticasone (FLONASE) 50 MCG/ACT nasal spray Place 2 sprays into the nose daily as needed.      . [DISCONTINUED] fluticasone (FLONASE) 50 MCG/ACT nasal spray Place 2 sprays into the nose daily.  16 g  6  . [DISCONTINUED] HYDROcodone-homatropine (HYCODAN) 5-1.5 MG/5ML syrup Take 5 mLs by mouth every 6 (six) hours as needed.  120 mL  0  . [DISCONTINUED] Orphenadrine-Aspirin-Caffeine (NORGESIC FORTE PO) Take by mouth as needed.       No facility-administered encounter medications on file as of 05/13/2012.

## 2012-05-15 ENCOUNTER — Ambulatory Visit: Payer: PRIVATE HEALTH INSURANCE | Admitting: Critical Care Medicine

## 2012-05-19 ENCOUNTER — Telehealth: Payer: Self-pay | Admitting: Critical Care Medicine

## 2012-05-19 NOTE — Telephone Encounter (Signed)
Pt was under the impression that she was supposed to take Augmentin for 10 days not 7. After looking through her chart, she was only to take augmentin for 7 days not 10.  She verbalized understanding and was just confused.

## 2012-06-03 ENCOUNTER — Encounter: Payer: Self-pay | Admitting: Critical Care Medicine

## 2012-06-03 ENCOUNTER — Ambulatory Visit (INDEPENDENT_AMBULATORY_CARE_PROVIDER_SITE_OTHER): Payer: Self-pay | Admitting: Critical Care Medicine

## 2012-06-03 VITALS — BP 126/74 | HR 72 | Temp 98.7°F | Ht 66.0 in | Wt 146.0 lb

## 2012-06-03 DIAGNOSIS — J45991 Cough variant asthma: Secondary | ICD-10-CM

## 2012-06-03 NOTE — Progress Notes (Signed)
Patient ID: Whitney [name], female   DOB: October 05, 1955, 57 y.o.   MRN: [id number]  Subjective:    Patient ID: Whitney [name], female    DOB: 02/18/1956, 57 y.o.   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   05/13/2012 Last seen 04/2011 Cough now x 3 weeks.  Pt had sinusitis 2/14: Rx amoxcil per ENT. 10days. Teoh.  Now uses  Did have atrovent some per nasal. No other nasal sprays.   Pt denies any dyspnea. Pt feels tight in the chest area. Worse when talking, cold air was a ppt factor Dry cough.  Notes some pndrip.  Can breath through the nose ok.  No heartburn. PPI is being used for years.  No belching/burping or dysphagia Sugar free candy is used  Hydrocodone helps, Delsym helps some.  Out of tessalon   06/03/2012 Finished cough protocol Rare tperle use The patient is doing well the cough is markedly better Pt denies any significant sore throat, nasal congestion or excess secretions, fever, chills, sweats, unintended weight loss, pleurtic or exertional chest pain, orthopnea PND, or leg swelling Pt denies any increase in rescue therapy over baseline, denies waking up needing it or having any early am or nocturnal exacerbations of coughing/wheezing/or dyspnea. Pt also denies any obvious fluctuation in symptoms with  weather or environmental change or other alleviating or aggravating factors     Review of Systems Constitutional:   No  weight loss, night sweats,  Fevers, chills, fatigue, or  lassitude.  HEENT:   No headaches,  Difficulty swallowing,  Tooth/dental problems, or  Sore throat,                No sneezing, itching, ear ache, nasal congestion, post nasal drip,   CV:  No chest pain,  Orthopnea, PND, swelling in lower extremities, anasarca, dizziness, palpitations, syncope.   GI  No heartburn, indigestion, abdominal pain, nausea, vomiting, diarrhea, change  in bowel habits, loss of appetite, bloody stools.   Resp:    No coughing up of blood.  No change in color of mucus.  No wheezing.  No chest wall deformity  Skin: no rash or lesions.  GU: no dysuria, change in color of urine, no urgency or frequency.  No flank pain, no hematuria   MS:  No joint pain or swelling.  No decreased range of motion.  No back pain.  Psych:  No change in mood or affect. No depression or anxiety.  No memory loss.          Objective:   Physical Exam Filed Vitals:   06/03/12 1332  BP: 126/74  Pulse: 72  Temp: 98.7 F (37.1 C)  TempSrc: Oral  Height: 5\' 6"  (1.676 m)  Weight: 146 lb (66.225 kg)  SpO2: 96%    Gen: Pleasant, well-nourished, in no distress,  normal affect  ENT: No lesions,  mouth clear,  oropharynx clear, no  postnasal drip, Resolved  nasal purulence  Neck: No JVD, no TMG, no carotid bruits  Lungs: No use of accessory muscles, no dullness to percussion,no  pseudowheeze   Cardiovascular: RRR, heart sounds normal, no murmur or gallops, no peripheral edema  Abdomen: soft and NT, no HSM,  BS normal  Musculoskeletal: No deformities, no cyanosis or clubbing  Neuro: alert, non focal  Skin: Warm, no lesions or rashes  No results found.     Assessment & Plan:  Cough variant asthma Cough variant asthma now improved Plan Reduce Qvar in one week to two puff daily, then after two more weeks can stop Qvar Use benzonatate as needed Protonix daily Stay on flonase through end of spring Return as needed for more direction     Updated Medication List Outpatient Encounter Prescriptions as of 06/03/2012  Medication Sig Dispense Refill  . albuterol (PROAIR HFA) 108 (90 BASE) MCG/ACT inhaler 2 puffs every 4-6 hrs as needed  1 Inhaler  1  . AXERT 12.5 MG tablet Take 1 tablet by mouth as needed. For migraines      . beclomethasone (QVAR) 40 MCG/ACT inhaler Inhale 2 puffs into the lungs 2 (two) times daily.      . benzonatate (TESSALON)  100 MG capsule Take 1-2 every 4 hours per cough protocol  60 capsule  6  . CALCIUM-VITAMIN D PO Take 1 tablet by mouth daily.      . fluticasone (FLONASE) 50 MCG/ACT nasal spray Place 2 sprays into the nose daily.  16 g  6  . Hydrocod Polst-Chlorphen Polst (TUSSICAPS) 10-8 MG CP12 Take 1 capsule by mouth 2 (two) times daily as needed.  30 each  4  . LUTEIN PO Take 1 capsule by mouth daily.      . pantoprazole (PROTONIX) 40 MG tablet Take 40 mg by mouth daily.      . traMADol (ULTRAM) 50 MG tablet Take 1 tablet (50 mg total) by mouth every 8 (eight) hours as needed.  30 tablet  0  . zolmitriptan (ZOMIG-ZMT) 5 MG disintegrating tablet Take 5 mg by mouth as needed.      . [DISCONTINUED] amoxicillin-clavulanate (AUGMENTIN) 875-125 MG per tablet Take 1 tablet by mouth 2 (two) times daily.  14 tablet  0  . [DISCONTINUED] ibuprofen (ADVIL,MOTRIN) 800 MG tablet Take 1 tablet (800 mg total) by mouth every 8 (eight) hours as needed for pain.  100 tablet  0  . [DISCONTINUED] predniSONE (DELTASONE) 10 MG tablet Take 4 for two days three for two days two for two days one for two days  20 tablet  0   No facility-administered encounter medications on file as of 06/03/2012.

## 2012-06-03 NOTE — Assessment & Plan Note (Signed)
Cough variant asthma now improved Plan Reduce Qvar in one week to two puff daily, then after two more weeks can stop Qvar Use benzonatate as needed Protonix daily Stay on flonase through end of spring Return as needed for more direction

## 2012-06-03 NOTE — Patient Instructions (Signed)
Reduce Qvar in one week to two puff daily, then after two more weeks can stop Qvar Use benzonatate as needed Protonix daily Stay on flonase through end of spring Return as needed for more direction

## 2012-06-17 ENCOUNTER — Emergency Department (HOSPITAL_COMMUNITY)
Admission: EM | Admit: 2012-06-17 | Discharge: 2012-06-17 | Disposition: A | Discharge status: Home / Self Care | Payer: BC Managed Care – PPO | Attending: Emergency Medicine | Admitting: Emergency Medicine

## 2012-06-17 ENCOUNTER — Encounter (HOSPITAL_COMMUNITY): Payer: Self-pay | Admitting: Nurse Practitioner

## 2012-06-17 ENCOUNTER — Emergency Department (HOSPITAL_COMMUNITY): Payer: BC Managed Care – PPO

## 2012-06-17 DIAGNOSIS — Z79899 Other long term (current) drug therapy: Secondary | ICD-10-CM | POA: Insufficient documentation

## 2012-06-17 DIAGNOSIS — S060XAA Concussion with loss of consciousness status unknown, initial encounter: Secondary | ICD-10-CM | POA: Insufficient documentation

## 2012-06-17 DIAGNOSIS — IMO0002 Reserved for concepts with insufficient information to code with codable children: Secondary | ICD-10-CM | POA: Insufficient documentation

## 2012-06-17 DIAGNOSIS — Z860100 Personal history of colon polyps, unspecified: Secondary | ICD-10-CM | POA: Insufficient documentation

## 2012-06-17 DIAGNOSIS — J45909 Unspecified asthma, uncomplicated: Secondary | ICD-10-CM | POA: Insufficient documentation

## 2012-06-17 DIAGNOSIS — S060X0A Concussion without loss of consciousness, initial encounter: Secondary | ICD-10-CM

## 2012-06-17 DIAGNOSIS — Y939 Activity, unspecified: Secondary | ICD-10-CM | POA: Insufficient documentation

## 2012-06-17 DIAGNOSIS — Y929 Unspecified place or not applicable: Secondary | ICD-10-CM | POA: Insufficient documentation

## 2012-06-17 DIAGNOSIS — G43909 Migraine, unspecified, not intractable, without status migrainosus: Secondary | ICD-10-CM | POA: Insufficient documentation

## 2012-06-17 DIAGNOSIS — S060X9A Concussion with loss of consciousness of unspecified duration, initial encounter: Secondary | ICD-10-CM | POA: Insufficient documentation

## 2012-06-17 DIAGNOSIS — Z8719 Personal history of other diseases of the digestive system: Secondary | ICD-10-CM | POA: Insufficient documentation

## 2012-06-17 DIAGNOSIS — Z8709 Personal history of other diseases of the respiratory system: Secondary | ICD-10-CM | POA: Insufficient documentation

## 2012-06-17 DIAGNOSIS — R42 Dizziness and giddiness: Secondary | ICD-10-CM | POA: Insufficient documentation

## 2012-06-17 DIAGNOSIS — K219 Gastro-esophageal reflux disease without esophagitis: Secondary | ICD-10-CM | POA: Insufficient documentation

## 2012-06-17 HISTORY — DX: Gastritis, unspecified, without bleeding: K29.70

## 2012-06-17 MED ORDER — TRAMADOL HCL 50 MG PO TABS: 50.0000 mg | ORAL_TABLET | Freq: Four times a day (QID) | ORAL | Status: DC | PRN

## 2012-06-17 MED ORDER — TRAMADOL HCL 50 MG PO TABS
50.0000 mg | ORAL_TABLET | Freq: Once | ORAL | Status: AC
  Administered 2012-06-17: 50 mg via ORAL
  Filled 2012-06-17: qty 1

## 2012-06-17 NOTE — ED Notes (Signed)
Pt reports she was standing from a bending position and hit her head on a cabinet. Denies LOC . Pt reports since then she feels "unsteady when walking," "Seeing stars" and feeling dizzy. Family reports she is seems off balance. A&Ox4, resp e/u

## 2012-06-17 NOTE — ED Provider Notes (Signed)
History     CSN: [id number]  Arrival date & time 06/17/12  1627   First MD Initiated Contact with Patient 06/17/12 1652      Chief Complaint  Patient presents with  . Head Injury    (Consider location/radiation/quality/duration/timing/severity/associated sxs/prior treatment) HPI Patient hit the top of her head on a wooden cabinet door 4 PM today causing severe pain to the top of her head, followed by lightheadedness and difficulty with balance for several minutes. Symptoms are improving steadily with time. She denies loss of consciousness denies nausea vomiting no other associated symptoms no treatment prior to coming here Past Medical History  Diagnosis Date  . Migraine   . GERD (gastroesophageal reflux disease)   . Personal history of colonic polyps   . Allergic rhinitis   . Cough variant asthma   . Bronchitis   . Gastritis     Past Surgical History  Procedure Laterality Date  . Trigger finger release    . Dilation and curettage of uterus    . Neck surgery    . Back surgery    . Septoplasty    . Breast biopsy      Family History  Problem Relation Age of Onset  . Hypertension Mother   . Hypertension Father   . Hypertension Other     siblings x2  . Heart disease Father   . Heart attack Father   . Lupus Maternal Aunt   . Arthritis Mother   . Breast cancer Maternal Aunt   . Prostate cancer Maternal Uncle     History  Substance Use Topics  . Smoking status: Never Smoker   . Smokeless tobacco: Never Used  . Alcohol Use: Yes     Comment: social    OB History   Grav Para Term Preterm Abortions TAB SAB Ect Mult Living                  Review of Systems  Constitutional: Negative.   Respiratory: Negative.   Cardiovascular: Negative.   Gastrointestinal: Negative.   Musculoskeletal: Negative.   Skin: Negative.   Neurological: Positive for dizziness and headaches.  Psychiatric/Behavioral: Negative.   All other systems reviewed and are  negative.    Allergies  Sulfonamide derivatives  Home Medications   Current Outpatient Rx  Name  Route  Sig  Dispense  Refill  . albuterol (PROAIR HFA) 108 (90 BASE) MCG/ACT inhaler      2 puffs every 4-6 hrs as needed   1 Inhaler   1   . AXERT 12.5 MG tablet   Oral   Take 1 tablet by mouth as needed. For migraines         . beclomethasone (QVAR) 40 MCG/ACT inhaler   Inhalation   Inhale 2 puffs into the lungs 2 (two) times daily.         . benzonatate (TESSALON) 100 MG capsule      Take 1-2 every 4 hours per cough protocol   60 capsule   6   . CALCIUM-VITAMIN D PO   Oral   Take 1 tablet by mouth daily.         . fluticasone (FLONASE) 50 MCG/ACT nasal spray   Nasal   Place 2 sprays into the nose daily.   16 g   6   . Hydrocod Polst-Chlorphen Polst (TUSSICAPS) 10-8 MG CP12   Oral   Take 1 capsule by mouth 2 (two) times daily as needed.   30 each  4   . LUTEIN PO   Oral   Take 1 capsule by mouth daily.         . pantoprazole (PROTONIX) 40 MG tablet   Oral   Take 40 mg by mouth daily.         . traMADol (ULTRAM) 50 MG tablet   Oral   Take 1 tablet (50 mg total) by mouth every 8 (eight) hours as needed.   30 tablet   0   . zolmitriptan (ZOMIG-ZMT) 5 MG disintegrating tablet   Oral   Take 5 mg by mouth as needed.           BP 155/87  Pulse 90  Temp(Src) 98 F (36.7 C) (Oral)  Resp 18  SpO2 95%  Physical Exam  Nursing note and vitals reviewed. Constitutional: She is oriented to person, place, and time. She appears well-developed and well-nourished. No distress.  HENT:  Head: Normocephalic and atraumatic.  Right Ear: External ear normal.  Left Ear: External ear normal.  Mildly tender top of scalp. No soft tissue swelling of scalp  Eyes: Conjunctivae are normal. Pupils are equal, round, and reactive to light.  Neck: Neck supple. No tracheal deviation present. No thyromegaly present.  No tenderness  Cardiovascular: Normal rate.    No murmur heard. Pulmonary/Chest: Effort normal.  Abdominal: She exhibits no distension.  Musculoskeletal: Normal range of motion.  Neurological: She is alert and oriented to person, place, and time. No cranial nerve deficit. Coordination normal.  Gait normal Romberg normal pronator drift normal  Skin: Skin is warm and dry. No rash noted.  Psychiatric: She has a normal mood and affect.    ED Course  Procedures (including critical care time)  Labs Reviewed - No data to display No results found. Results for orders placed during the hospital encounter of 12/11/08  CBC      Result Value Range   WBC 6.1  4.0 - 10.5 K/uL   RBC 4.08  3.87 - 5.11 MIL/uL   Hemoglobin 13.7  12.0 - 15.0 g/dL   HCT 40.9  81.1 - 91.4 %   MCV 96.6  78.0 - 100.0 fL   MCHC 34.8  30.0 - 36.0 g/dL   RDW 78.2  95.6 - 21.3 %   Platelets 155  150 - 400 K/uL  COMPREHENSIVE METABOLIC PANEL      Result Value Range   Sodium 139  135 - 145 mEq/L   Potassium 4.2  3.5 - 5.1 mEq/L   Chloride 105  96 - 112 mEq/L   CO2 29  19 - 32 mEq/L   Glucose, Bld 95  70 - 99 mg/dL   BUN 18  6 - 23 mg/dL   Creatinine, Ser .8  0.4 - 1.2 mg/dL   Calcium 9.5  8.4 - 08.6 mg/dL   Total Protein 7.2  6.0 - 8.3 g/dL   Albumin 4.4  3.5 - 5.2 g/dL   AST 35  0 - 37 U/L   ALT 33  0 - 35 U/L   Alkaline Phosphatase 63  39 - 117 U/L   Total Bilirubin 0.6  0.3 - 1.2 mg/dL   GFR calc non Af Amer >60  >60 mL/min   GFR calc Af Amer    >60 mL/min   Value: >60            The eGFR has been calculated     using the MDRD equation.     This calculation has not been  validated in all clinical     situations.     eGFR's persistently     <60 mL/min signify     possible Chronic Kidney Disease.  DIFFERENTIAL      Result Value Range   Neutrophils Relative 62  43 - 77 %   Neutro Abs 3.8  1.7 - 7.7 K/uL   Lymphocytes Relative 34  12 - 46 %   Lymphs Abs 2.1  0.7 - 4.0 K/uL   Monocytes Relative 4  3 - 12 %   Monocytes Absolute 0.2  0.1 - 1.0 K/uL    Eosinophils Relative 1  0 - 5 %   Eosinophils Absolute 0.0  0.0 - 0.7 K/uL   Basophils Relative 0  0 - 1 %   Basophils Absolute 0.0  0.0 - 0.1 K/uL   Ct Head Wo Contrast  06/17/2012  *RADIOLOGY REPORT*  Clinical Data: 57 year old female status post blunt trauma. Unsteady, visual changes, dizzy, off balance.  CT HEAD WITHOUT CONTRAST  Technique:  Contiguous axial images were obtained from the base of the skull through the vertex without contrast.  Comparison: 12/11/2008.  Findings: Visualized paranasal sinuses and mastoids are clear. Visualized orbits and scalp soft tissues are within normal limits. No acute osseous abnormality identified.  Cerebral volume is within normal limits for age.  No midline shift, ventriculomegaly, mass effect, evidence of mass lesion, intracranial hemorrhage or evidence of cortically based acute infarction.  Gray-white matter differentiation is within normal limits throughout the brain.  No suspicious intracranial vascular hyperdensity.  IMPRESSION: Stable and normal for age noncontrast CT appearance of the brain.   Original Report Authenticated By: Erskine Speed, M.D.      No diagnosis found.  5:50 PM feels improved after treatment with tramadol . Alert and ambulatory Glasgow Coma Score 15  MDM  Plan prescription tramadol Diagnosis concussion        Doug Sou, MD 06/17/12 1755

## 2012-10-23 ENCOUNTER — Other Ambulatory Visit: Payer: Self-pay | Admitting: Obstetrics and Gynecology

## 2012-10-30 ENCOUNTER — Other Ambulatory Visit: Payer: Self-pay

## 2012-10-30 DIAGNOSIS — Z1231 Encounter for screening mammogram for malignant neoplasm of breast: Secondary | ICD-10-CM

## 2012-11-15 DIAGNOSIS — G43709 Chronic migraine without aura, not intractable, without status migrainosus: Secondary | ICD-10-CM | POA: Insufficient documentation

## 2012-11-25 ENCOUNTER — Ambulatory Visit
Admission: RE | Admit: 2012-11-25 | Discharge: 2012-11-25 | Disposition: A | Discharge status: Home / Self Care | Payer: BC Managed Care – PPO | Source: Ambulatory Visit

## 2012-11-25 DIAGNOSIS — Z1231 Encounter for screening mammogram for malignant neoplasm of breast: Secondary | ICD-10-CM

## 2013-02-02 ENCOUNTER — Telehealth: Payer: Self-pay | Admitting: *Deleted

## 2013-02-02 NOTE — Telephone Encounter (Addendum)
Pt called states [facility] will be sending a request for Lam[facility]il refill.  I contacted [facility] [phone] and the pharmacist states pt filled the Lamisil 250mg  #30 on 10/26/2012, 11/27/2012, and 12/27/2012.  I called [phone], left message stating only that she had taken the full therapeutic dosing of 90 days, no additional refills needed.  I did not leave the name of the medication.  Dr Sherald Hess called me back and stated Dr Ralene Cork said the process would take a long time and he would order a 4th dosing.  I informed DR Radde, I would present to Dr Ralene Cork 1st thing in the morning and get right back to her since I had no documentation for the 4th dosing.02/03/2013 Dr Ralene Cork states if necessary will order a 4th round at 6 month period, Lamisil stays in the tissue for 6 to 9 months.  Orders called to [phone], left message with Lanora Manis, and I did not mention the medication name.

## 2013-02-25 ENCOUNTER — Telehealth: Payer: Self-pay | Admitting: Critical Care Medicine

## 2013-02-25 MED ORDER — TRAMADOL HCL 50 MG PO TABS: 50.0000 mg | ORAL_TABLET | Freq: Four times a day (QID) | ORAL | Status: DC | PRN

## 2013-02-25 NOTE — Telephone Encounter (Signed)
Spoke with pt  She states was txed with clindamycin for tonsillitis recently  Since then having increased cough- non prod  She has started back on zyrtec  She has refill on tessalon, but it requesting a refill on tramadol to help suppress the cough  Please advise thanks!

## 2013-02-25 NOTE — Telephone Encounter (Signed)
[facility].  Pt aware. 

## 2013-02-25 NOTE — Telephone Encounter (Signed)
i am ok with tramadol 50mg  1-2 every 6hours prn #40

## 2013-03-19 ENCOUNTER — Telehealth: Payer: Self-pay | Admitting: Critical Care Medicine

## 2013-03-19 NOTE — Telephone Encounter (Signed)
Last OV 06/03/12 No pending OV at this time Last fill 02/25/13 #40  PW - please advise on refill. Thanks.

## 2013-03-19 NOTE — Telephone Encounter (Signed)
Ok to refill 

## 2013-03-20 MED ORDER — TRAMADOL HCL 50 MG PO TABS: 50.0000 mg | ORAL_TABLET | Freq: Four times a day (QID) | ORAL | Status: DC | PRN

## 2013-03-20 NOTE — Telephone Encounter (Signed)
lmomtcb x1 for pt  [facility]

## 2013-03-20 NOTE — Telephone Encounter (Signed)
Pt called back and made her aware. Nothing further needed

## 2013-09-11 ENCOUNTER — Ambulatory Visit (INDEPENDENT_AMBULATORY_CARE_PROVIDER_SITE_OTHER): Payer: BC Managed Care – PPO

## 2013-09-11 VITALS — BP 109/68 | HR 77 | Resp 18

## 2013-09-11 DIAGNOSIS — B351 Tinea unguium: Secondary | ICD-10-CM

## 2013-09-11 MED ORDER — EFINACONAZOLE 10 % EX SOLN: CUTANEOUS | Status: DC

## 2013-09-11 NOTE — Progress Notes (Signed)
   Subjective:    Patient ID: Whitney [name], female    DOB: Mar 30, 1955, 58 y.o.   MRN: [id number]  HPI I HAVE SOME TOENAILS THAT I WANT HIM TO LOOK AT AND IT IS THE BIG TOENAILS AND FEELS PRESSURE IF MY SHOES ARE TIGHT AND I THOUGHT THEY WOULD GROW OUT NORMAL AND THEY ARE CROOKED SOME    Review of Systems no new findings or systemic changes noted at this time     Objective:   Physical Exam 58 year old white female well-developed well-nourished right history presents at this time for followup of fungal nails has been on treatment before has had partial nail excision is done ingrown nails continues to have thickness yellowing discoloration and fungal nails both hallux. There is some proximal clearing from previous treatment with a 3 month regimen of Lamisil however not complete resolution at this time. Patient also noticed lateral deviation of the nail as is growing this is likely associated with a fungus in curvature the nail to tight or enclosed narrow shoes. Neurovascular status is intact pedal pulses are palpable epicritic sensations unremarkable no other abnormal findings noted the remaining nails are relatively normal trophic       Assessment & Plan:  Assessment likely onychomycosis nails although responding not completely resolved following Lamisil therapy at this time consider alternative and the new prescription for Jublia topical nail on clinical is given we'll apply once daily to the affected nails for 6-12 months as instructed the medication will be delivered directly from unlike terms apply once daily reappointed 6 months if no improvement also discussed briefly the use of laser if no significant improvement over time the laser edge of therapy may be considered  Alvan Dameichard Maram Bently DPM

## 2013-09-11 NOTE — Patient Instructions (Signed)

## 2013-12-08 ENCOUNTER — Other Ambulatory Visit: Payer: Self-pay | Admitting: Gastroenterology

## 2013-12-08 DIAGNOSIS — R140 Abdominal distension (gaseous): Secondary | ICD-10-CM

## 2013-12-08 DIAGNOSIS — IMO0001 Reserved for inherently not codable concepts without codable children: Secondary | ICD-10-CM

## 2013-12-09 ENCOUNTER — Other Ambulatory Visit: Payer: Self-pay | Admitting: Gastroenterology

## 2013-12-09 DIAGNOSIS — R140 Abdominal distension (gaseous): Secondary | ICD-10-CM

## 2013-12-09 DIAGNOSIS — IMO0001 Reserved for inherently not codable concepts without codable children: Secondary | ICD-10-CM

## 2013-12-14 ENCOUNTER — Other Ambulatory Visit: Payer: BC Managed Care – PPO

## 2013-12-15 ENCOUNTER — Other Ambulatory Visit: Payer: BC Managed Care – PPO

## 2013-12-16 ENCOUNTER — Ambulatory Visit
Admission: RE | Admit: 2013-12-16 | Discharge: 2013-12-16 | Disposition: A | Discharge status: Home / Self Care | Payer: BC Managed Care – PPO | Source: Ambulatory Visit | Attending: Gastroenterology | Admitting: Gastroenterology

## 2013-12-16 DIAGNOSIS — IMO0001 Reserved for inherently not codable concepts without codable children: Secondary | ICD-10-CM

## 2013-12-16 DIAGNOSIS — R140 Abdominal distension (gaseous): Secondary | ICD-10-CM

## 2013-12-17 ENCOUNTER — Other Ambulatory Visit: Payer: BC Managed Care – PPO

## 2013-12-24 ENCOUNTER — Other Ambulatory Visit: Payer: Self-pay | Admitting: Obstetrics and Gynecology

## 2013-12-25 LAB — CYTOLOGY - PAP

## 2014-01-22 ENCOUNTER — Other Ambulatory Visit: Payer: Self-pay

## 2014-01-22 DIAGNOSIS — Z1231 Encounter for screening mammogram for malignant neoplasm of breast: Secondary | ICD-10-CM

## 2014-02-12 ENCOUNTER — Other Ambulatory Visit: Payer: Self-pay

## 2014-02-12 ENCOUNTER — Ambulatory Visit
Admission: RE | Admit: 2014-02-12 | Discharge: 2014-02-12 | Disposition: A | Discharge status: Home / Self Care | Payer: BC Managed Care – PPO | Source: Ambulatory Visit

## 2014-02-12 DIAGNOSIS — Z1231 Encounter for screening mammogram for malignant neoplasm of breast: Secondary | ICD-10-CM

## 2014-10-15 DIAGNOSIS — T148XXA Other injury of unspecified body region, initial encounter: Secondary | ICD-10-CM | POA: Insufficient documentation

## 2015-02-16 ENCOUNTER — Other Ambulatory Visit: Payer: Self-pay

## 2015-02-16 DIAGNOSIS — Z1231 Encounter for screening mammogram for malignant neoplasm of breast: Secondary | ICD-10-CM

## 2015-03-09 ENCOUNTER — Other Ambulatory Visit: Payer: Self-pay | Admitting: Obstetrics and Gynecology

## 2015-03-09 ENCOUNTER — Ambulatory Visit
Admission: RE | Admit: 2015-03-09 | Discharge: 2015-03-09 | Disposition: A | Discharge status: Home / Self Care | Payer: BLUE CROSS/BLUE SHIELD | Source: Ambulatory Visit | Attending: Obstetrics and Gynecology | Admitting: Obstetrics and Gynecology

## 2015-03-09 ENCOUNTER — Ambulatory Visit
Admission: RE | Admit: 2015-03-09 | Discharge: 2015-03-09 | Disposition: A | Discharge status: Home / Self Care | Payer: BLUE CROSS/BLUE SHIELD | Source: Ambulatory Visit

## 2015-03-09 DIAGNOSIS — N6320 Unspecified lump in the left breast, unspecified quadrant: Secondary | ICD-10-CM

## 2015-03-09 DIAGNOSIS — Z1231 Encounter for screening mammogram for malignant neoplasm of breast: Secondary | ICD-10-CM

## 2016-01-22 ENCOUNTER — Other Ambulatory Visit: Payer: Self-pay | Admitting: Plastic Surgery

## 2016-01-22 ENCOUNTER — Ambulatory Visit (HOSPITAL_COMMUNITY)
Admission: RE | Admit: 2016-01-22 | Discharge: 2016-01-22 | Disposition: A | Discharge status: Home / Self Care | Payer: BLUE CROSS/BLUE SHIELD | Source: Ambulatory Visit | Attending: Plastic Surgery | Admitting: Plastic Surgery

## 2016-01-22 DIAGNOSIS — T1490XA Injury, unspecified, initial encounter: Secondary | ICD-10-CM | POA: Insufficient documentation

## 2016-01-22 DIAGNOSIS — X58XXXA Exposure to other specified factors, initial encounter: Secondary | ICD-10-CM | POA: Diagnosis not present

## 2017-03-14 ENCOUNTER — Other Ambulatory Visit: Payer: Self-pay | Admitting: Obstetrics and Gynecology

## 2017-03-14 DIAGNOSIS — Z1231 Encounter for screening mammogram for malignant neoplasm of breast: Secondary | ICD-10-CM

## 2017-03-21 ENCOUNTER — Ambulatory Visit
Admission: RE | Admit: 2017-03-21 | Discharge: 2017-03-21 | Disposition: A | Discharge status: Home / Self Care | Payer: BLUE CROSS/BLUE SHIELD | Source: Ambulatory Visit | Attending: Obstetrics and Gynecology | Admitting: Obstetrics and Gynecology

## 2017-03-21 DIAGNOSIS — Z1231 Encounter for screening mammogram for malignant neoplasm of breast: Secondary | ICD-10-CM

## 2018-03-17 ENCOUNTER — Other Ambulatory Visit: Payer: Self-pay | Admitting: Obstetrics and Gynecology

## 2018-03-17 DIAGNOSIS — Z1231 Encounter for screening mammogram for malignant neoplasm of breast: Secondary | ICD-10-CM

## 2018-04-10 ENCOUNTER — Ambulatory Visit
Admission: RE | Admit: 2018-04-10 | Discharge: 2018-04-10 | Disposition: A | Discharge status: Home / Self Care | Payer: BLUE CROSS/BLUE SHIELD | Source: Ambulatory Visit | Attending: Obstetrics and Gynecology | Admitting: Obstetrics and Gynecology

## 2018-04-10 DIAGNOSIS — Z1231 Encounter for screening mammogram for malignant neoplasm of breast: Secondary | ICD-10-CM | POA: Diagnosis not present

## 2018-06-09 DIAGNOSIS — G43709 Chronic migraine without aura, not intractable, without status migrainosus: Secondary | ICD-10-CM | POA: Diagnosis not present

## 2018-07-10 DIAGNOSIS — G43709 Chronic migraine without aura, not intractable, without status migrainosus: Secondary | ICD-10-CM | POA: Diagnosis not present

## 2018-10-11 DIAGNOSIS — G43709 Chronic migraine without aura, not intractable, without status migrainosus: Secondary | ICD-10-CM | POA: Diagnosis not present

## 2018-10-14 DIAGNOSIS — G43709 Chronic migraine without aura, not intractable, without status migrainosus: Secondary | ICD-10-CM | POA: Diagnosis not present

## 2019-01-15 DIAGNOSIS — G43709 Chronic migraine without aura, not intractable, without status migrainosus: Secondary | ICD-10-CM | POA: Diagnosis not present

## 2019-02-03 DIAGNOSIS — Z23 Encounter for immunization: Secondary | ICD-10-CM | POA: Diagnosis not present

## 2019-02-18 DIAGNOSIS — G43709 Chronic migraine without aura, not intractable, without status migrainosus: Secondary | ICD-10-CM | POA: Diagnosis not present

## 2019-03-05 DIAGNOSIS — H524 Presbyopia: Secondary | ICD-10-CM | POA: Diagnosis not present

## 2019-03-20 DIAGNOSIS — Z1211 Encounter for screening for malignant neoplasm of colon: Secondary | ICD-10-CM | POA: Diagnosis not present

## 2019-03-20 DIAGNOSIS — K219 Gastro-esophageal reflux disease without esophagitis: Secondary | ICD-10-CM | POA: Diagnosis not present

## 2019-04-13 DIAGNOSIS — Z6825 Body mass index (BMI) 25.0-25.9, adult: Secondary | ICD-10-CM | POA: Diagnosis not present

## 2019-04-13 DIAGNOSIS — Z01419 Encounter for gynecological examination (general) (routine) without abnormal findings: Secondary | ICD-10-CM | POA: Diagnosis not present

## 2019-04-14 DIAGNOSIS — Z01419 Encounter for gynecological examination (general) (routine) without abnormal findings: Secondary | ICD-10-CM | POA: Diagnosis not present

## 2019-05-01 DIAGNOSIS — G43709 Chronic migraine without aura, not intractable, without status migrainosus: Secondary | ICD-10-CM | POA: Diagnosis not present

## 2019-06-02 DIAGNOSIS — G43709 Chronic migraine without aura, not intractable, without status migrainosus: Secondary | ICD-10-CM | POA: Diagnosis not present

## 2019-06-05 ENCOUNTER — Other Ambulatory Visit: Payer: Self-pay | Admitting: Obstetrics and Gynecology

## 2019-06-05 DIAGNOSIS — Z1231 Encounter for screening mammogram for malignant neoplasm of breast: Secondary | ICD-10-CM

## 2019-08-18 DIAGNOSIS — G43709 Chronic migraine without aura, not intractable, without status migrainosus: Secondary | ICD-10-CM | POA: Diagnosis not present

## 2019-08-21 DIAGNOSIS — Z1211 Encounter for screening for malignant neoplasm of colon: Secondary | ICD-10-CM | POA: Diagnosis not present

## 2019-09-01 DIAGNOSIS — G43709 Chronic migraine without aura, not intractable, without status migrainosus: Secondary | ICD-10-CM | POA: Diagnosis not present

## 2019-11-02 DIAGNOSIS — G43709 Chronic migraine without aura, not intractable, without status migrainosus: Secondary | ICD-10-CM | POA: Diagnosis not present

## 2019-11-12 DIAGNOSIS — Z20822 Contact with and (suspected) exposure to covid-19: Secondary | ICD-10-CM | POA: Diagnosis not present

## 2019-12-03 DIAGNOSIS — G43709 Chronic migraine without aura, not intractable, without status migrainosus: Secondary | ICD-10-CM | POA: Diagnosis not present

## 2019-12-30 DIAGNOSIS — R52 Pain, unspecified: Secondary | ICD-10-CM | POA: Insufficient documentation

## 2019-12-30 DIAGNOSIS — S5001XA Contusion of right elbow, initial encounter: Secondary | ICD-10-CM | POA: Diagnosis not present

## 2020-01-19 DIAGNOSIS — G43709 Chronic migraine without aura, not intractable, without status migrainosus: Secondary | ICD-10-CM | POA: Diagnosis not present

## 2020-01-26 ENCOUNTER — Other Ambulatory Visit (HOSPITAL_COMMUNITY): Payer: Self-pay | Admitting: Family

## 2020-01-26 ENCOUNTER — Telehealth: Payer: Self-pay | Admitting: Internal Medicine

## 2020-01-26 DIAGNOSIS — U071 COVID-19: Secondary | ICD-10-CM

## 2020-01-26 MED ORDER — AZITHROMYCIN 250 MG PO TABS: ORAL_TABLET | ORAL | 0 refills | Status: DC

## 2020-01-26 MED ORDER — HYDROXYCHLOROQUINE SULFATE 200 MG PO TABS: ORAL_TABLET | ORAL | 0 refills | Status: DC

## 2020-01-26 NOTE — Telephone Encounter (Signed)
C/o sniffles, x 2-3 d, cough today - worse. COVID(+) test today. Vaccinated Given # for monoclonal inf clinic Hydroxychlor Zpack F/u OV when (-)

## 2020-01-26 NOTE — Progress Notes (Signed)
I connected by phone with Whitney [name] on 01/26/2020 at 6:09 PM to discuss the potential use of a new treatment for mild to moderate COVID-19 viral infection in non-hospitalized patients.  This patient is a 64 y.o. female that meets the FDA criteria for Emergency Use Authorization of COVID monoclonal antibody casirivimab/imdevimab, bamlanivimab/eteseviamb, or sotrovimab.  Has a (+) direct SARS-CoV-2 viral test result  Has mild or moderate COVID-19   Is NOT hospitalized due to COVID-19  Is within 10 days of symptom onset  Has at least one of the high risk factor(s) for progression to severe COVID-19 and/or hospitalization as defined in EUA.  Specific high risk criteria : Chronic Lung Disease   Symptoms of cough, nasal drainage, burning with cough began 01/25/20.   I have spoken and communicated the following to the patient or parent/caregiver regarding COVID monoclonal antibody treatment:  1. FDA has authorized the emergency use for the treatment of mild to moderate COVID-19 in adults and pediatric patients with positive results of direct SARS-CoV-2 viral testing who are 64 years of age and older weighing at least 40 kg, and who are at high risk for progressing to severe COVID-19 and/or hospitalization.  2. The significant known and potential risks and benefits of COVID monoclonal antibody, and the extent to which such potential risks and benefits are unknown.  3. Information on available alternative treatments and the risks and benefits of those alternatives, including clinical trials.  4. Patients treated with COVID monoclonal antibody should continue to self-isolate and use infection control measures (e.g., wear mask, isolate, social distance, avoid sharing personal items, clean and disinfect "high touch" surfaces, and frequent handwashing) according to CDC guidelines.   5. The patient or parent/caregiver has the option to accept or refuse COVID monoclonal antibody  treatment.  After reviewing this information with the patient, the patient has agreed to receive one of the available covid 19 monoclonal antibodies and will be provided an appropriate fact sheet prior to infusion. Morton Stall, NP 01/26/2020 6:09 PM

## 2020-01-27 ENCOUNTER — Ambulatory Visit (HOSPITAL_COMMUNITY)
Admission: RE | Admit: 2020-01-27 | Discharge: 2020-01-27 | Disposition: A | Discharge status: Home / Self Care | Payer: BC Managed Care – PPO | Source: Ambulatory Visit | Attending: Pulmonary Disease | Admitting: Pulmonary Disease

## 2020-01-27 ENCOUNTER — Telehealth: Payer: Self-pay | Admitting: Internal Medicine

## 2020-01-27 DIAGNOSIS — J989 Respiratory disorder, unspecified: Secondary | ICD-10-CM | POA: Diagnosis not present

## 2020-01-27 DIAGNOSIS — U071 COVID-19: Secondary | ICD-10-CM | POA: Diagnosis not present

## 2020-01-27 MED ORDER — SODIUM CHLORIDE 0.9 % IV SOLN: INTRAVENOUS | Status: DC | PRN

## 2020-01-27 MED ORDER — METHYLPREDNISOLONE SODIUM SUCC 125 MG IJ SOLR: 125.0000 mg | Freq: Once | INTRAMUSCULAR | Status: DC | PRN

## 2020-01-27 MED ORDER — ALBUTEROL SULFATE HFA 108 (90 BASE) MCG/ACT IN AERS: 2.0000 | INHALATION_SPRAY | RESPIRATORY_TRACT | 5 refills | Status: DC | PRN

## 2020-01-27 MED ORDER — SOTROVIMAB 500 MG/8ML IV SOLN
500.0000 mg | Freq: Once | INTRAVENOUS | Status: AC
  Administered 2020-01-27: 500 mg via INTRAVENOUS

## 2020-01-27 MED ORDER — FAMOTIDINE IN NACL 20-0.9 MG/50ML-% IV SOLN: 20.0000 mg | Freq: Once | INTRAVENOUS | Status: DC | PRN

## 2020-01-27 MED ORDER — DIPHENHYDRAMINE HCL 50 MG/ML IJ SOLN: 50.0000 mg | Freq: Once | INTRAMUSCULAR | Status: DC | PRN

## 2020-01-27 MED ORDER — ALBUTEROL SULFATE HFA 108 (90 BASE) MCG/ACT IN AERS: 2.0000 | INHALATION_SPRAY | Freq: Once | RESPIRATORY_TRACT | Status: DC | PRN

## 2020-01-27 MED ORDER — EPINEPHRINE 0.3 MG/0.3ML IJ SOAJ: 0.3000 mg | Freq: Once | INTRAMUSCULAR | Status: DC | PRN

## 2020-01-27 NOTE — Discharge Instructions (Signed)

## 2020-01-27 NOTE — Progress Notes (Signed)
Patient reviewed Fact Sheet for Patients, Parents, and Caregivers for Emergency Use Authorization (EUA) of Sotrovimab for the Treatment of Coronavirus. Patient also reviewed and is agreeable to the estimated cost of treatment. Patient is agreeable to proceed.   

## 2020-01-27 NOTE — Telephone Encounter (Signed)
Needs Ventolin

## 2020-01-27 NOTE — Progress Notes (Signed)
  Diagnosis: COVID-19  Physician: Dr. Patrick Wright   Procedure:   Medication fact sheet provided to patient; all questions answered.  Allergies reviewed with patient.  IV placed.  Sotrovimab administered via IV infusion.   Complications: No immediate complications noted.  Discharge: Discharged home   Whitney [name] 01/27/2020   

## 2020-01-27 NOTE — Progress Notes (Signed)
  Diagnosis: COVID-19  Physician:dr wright  Procedure:Diagnosis: COVID-19  Physician: Dr. Patrick Wright  Procedure: [facility] Infusion Clinic Med: Sotrovimab infusion - Provided patient with sotrovimab fact sheet for patients, parents, and caregivers prior to infusion.   Complications: No immediate complications noted  Discharge: Discharged home  If after the infusion you have any questions or concerns please call the Advanced Practice Provider at [phone]  Complications: No immediate complications noted.  Discharge: Discharged home   Nazanin Kinner S Melane Windholz 01/27/2020  

## 2020-02-09 IMAGING — MG DIGITAL SCREENING BILATERAL MAMMOGRAM WITH CAD
4 series · 4 of 4 positions shown · non-contrast
Comparison: Previous exam(s).

CLINICAL DATA: Screening.

EXAM:
DIGITAL SCREENING BILATERAL MAMMOGRAM WITH CAD

[R MLO]
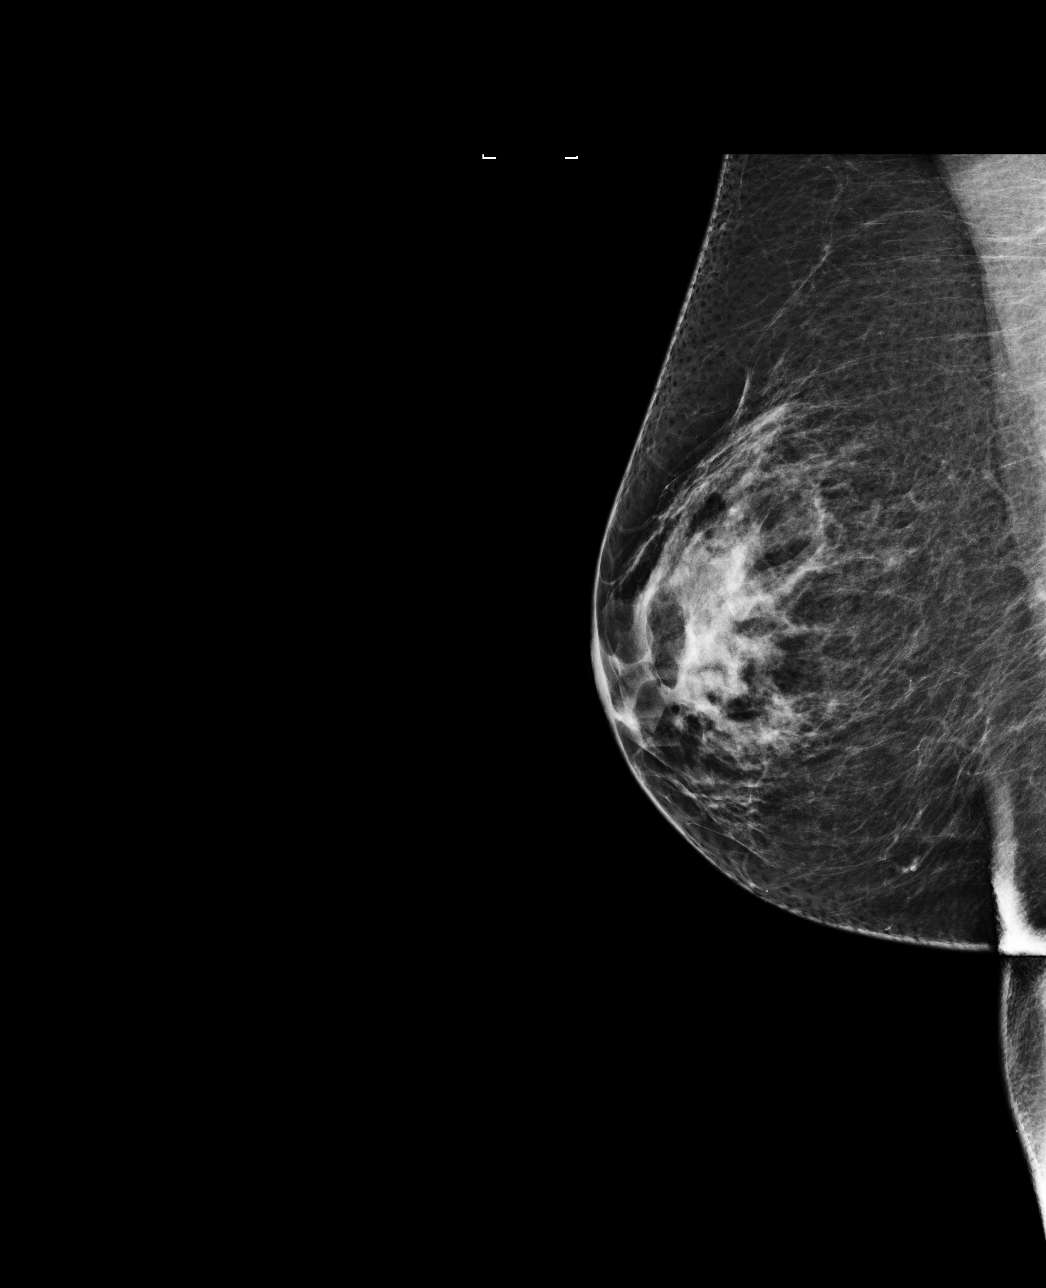

[L CC]
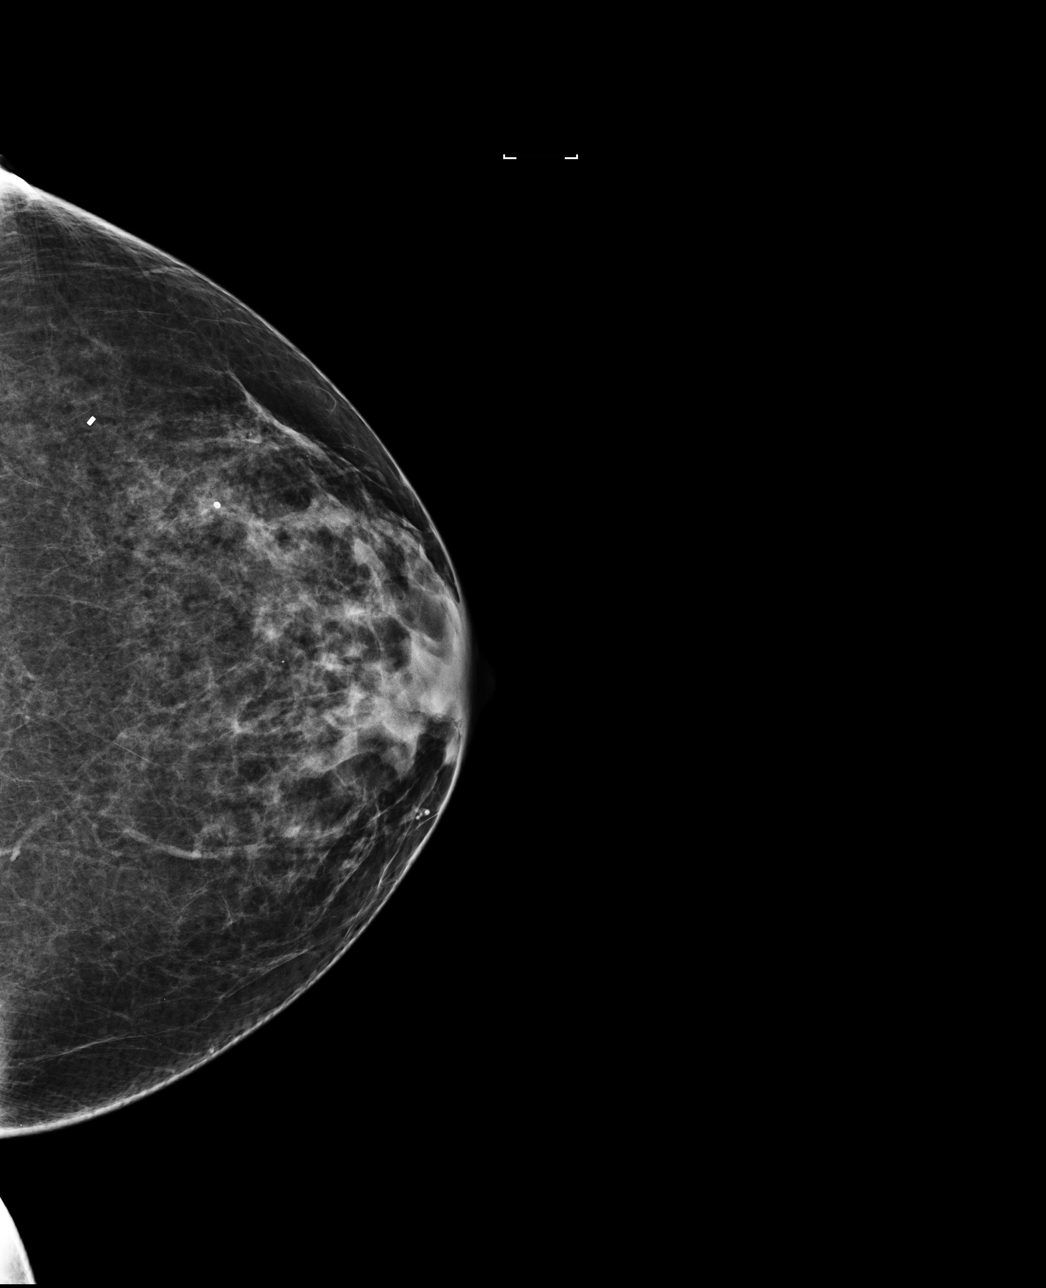

[L MLO]
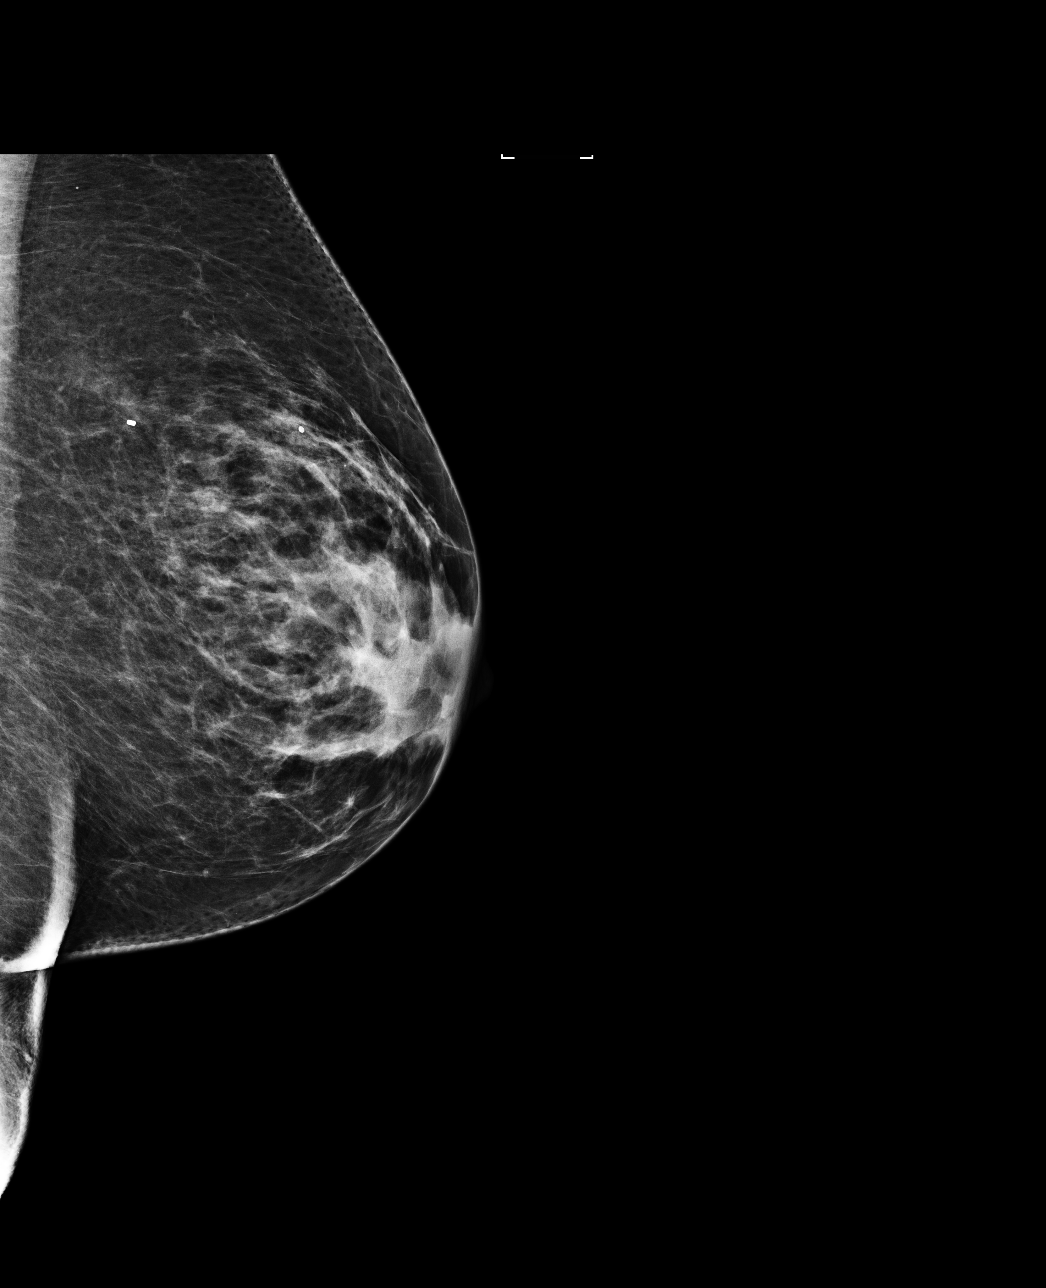

[R CC]
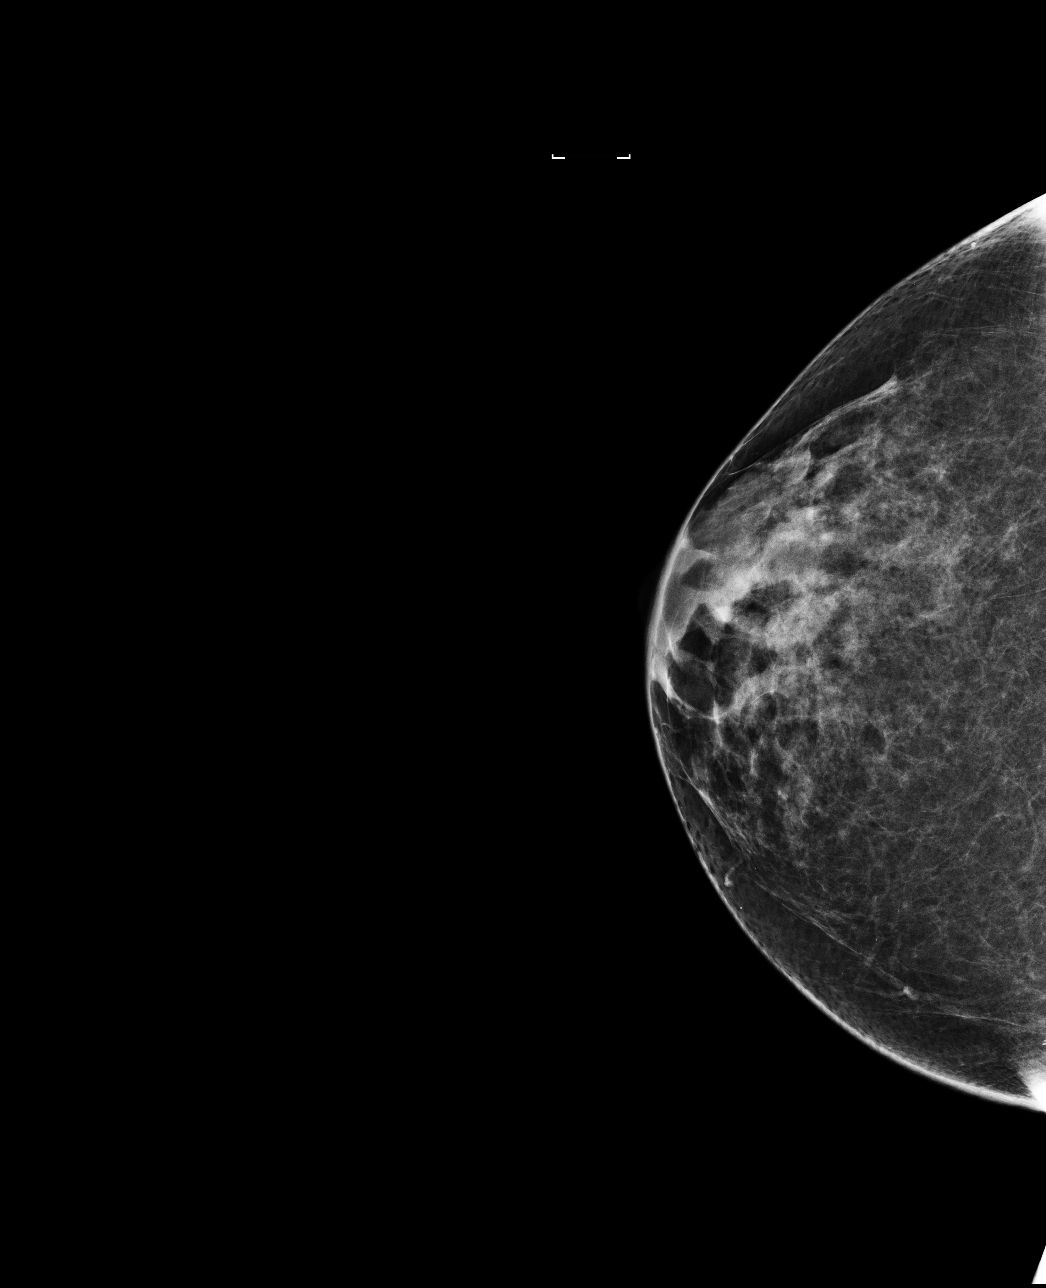

[4 of 4 positions shown; findings below may reference images not displayed]

ACR Breast Density Category c: The breast tissue is heterogeneously
dense, which may obscure small masses.
FINDINGS: There are no findings suspicious for malignancy. No significant
change from 0166.

Images were processed with CAD.
IMPRESSION: No mammographic evidence of malignancy. A result letter of this
screening mammogram will be mailed directly to the patient.

RECOMMENDATION:
Screening mammogram in one year. (Code:58-T-DO3)

BI-RADS CATEGORY  1: Negative.

## 2020-02-15 ENCOUNTER — Telehealth: Payer: Self-pay | Admitting: Internal Medicine

## 2020-02-15 MED ORDER — BREO ELLIPTA 100-25 MCG/INH IN AEPB: 1.0000 | INHALATION_SPRAY | Freq: Every day | RESPIRATORY_TRACT | 1 refills | Status: DC

## 2020-02-15 MED ORDER — BENZONATATE 200 MG PO CAPS: 200.0000 mg | ORAL_CAPSULE | Freq: Three times a day (TID) | ORAL | 1 refills | Status: DC | PRN

## 2020-02-15 NOTE — Telephone Encounter (Signed)
C/o post-covid cough Breo Tessalon  Lucy, Pleasesch a new pt OV for her pls  Thanks, AP

## 2020-02-16 NOTE — Telephone Encounter (Signed)
Called pt had to leave msg for her to RTC to make new pt appt.Marland KitchenRaechel [name]

## 2020-02-17 NOTE — Telephone Encounter (Signed)
Called pt again still no answer LMOM to RTC.Marland Kitchenlmb

## 2020-02-18 NOTE — Telephone Encounter (Signed)
Per chart pt called back yesterday and made new pt appt w/Dr. Macario Golds.Marland KitchenRaechel [name]

## 2020-03-01 ENCOUNTER — Other Ambulatory Visit: Payer: Self-pay

## 2020-03-01 ENCOUNTER — Ambulatory Visit: Payer: BC Managed Care – PPO | Admitting: Internal Medicine

## 2020-03-01 ENCOUNTER — Encounter: Payer: Self-pay | Admitting: Internal Medicine

## 2020-03-01 VITALS — BP 160/80 | HR 86 | Temp 98.3°F | Ht 66.0 in | Wt 140.0 lb

## 2020-03-01 DIAGNOSIS — Z0000 Encounter for general adult medical examination without abnormal findings: Secondary | ICD-10-CM | POA: Diagnosis not present

## 2020-03-01 DIAGNOSIS — E785 Hyperlipidemia, unspecified: Secondary | ICD-10-CM

## 2020-03-01 DIAGNOSIS — G43709 Chronic migraine without aura, not intractable, without status migrainosus: Secondary | ICD-10-CM | POA: Diagnosis not present

## 2020-03-01 DIAGNOSIS — U071 COVID-19: Secondary | ICD-10-CM | POA: Diagnosis not present

## 2020-03-01 DIAGNOSIS — K635 Polyp of colon: Secondary | ICD-10-CM

## 2020-03-01 DIAGNOSIS — G43909 Migraine, unspecified, not intractable, without status migrainosus: Secondary | ICD-10-CM | POA: Insufficient documentation

## 2020-03-01 DIAGNOSIS — J45991 Cough variant asthma: Secondary | ICD-10-CM

## 2020-03-01 MED ORDER — HYDROCOD POLST-CPM POLST ER 10-8 MG/5ML PO SUER: 5.0000 mL | Freq: Two times a day (BID) | ORAL | 0 refills | Status: DC | PRN

## 2020-03-01 MED ORDER — VITAMIN D3 50 MCG (2000 UT) PO CAPS: 2000.0000 [IU] | ORAL_CAPSULE | Freq: Every day | ORAL | 3 refills | Status: AC

## 2020-03-01 NOTE — Patient Instructions (Addendum)
Cardiac CT calcium scoring test $150 Tel # is [phone]   Computed tomography, more commonly known as a CT or CAT scan, is a diagnostic medical imaging test. Like traditional x-rays, it produces multiple images or pictures of the inside of the body. The cross-sectional images generated during a CT scan can be reformatted in multiple planes. They can even generate three-dimensional images. These images can be viewed on a computer monitor, printed on film or by a 3D printer, or transferred to a CD or DVD. CT images of internal organs, bones, soft tissue and blood vessels provide greater detail than traditional x-rays, particularly of soft tissues and blood vessels. A cardiac CT scan for coronary calcium is a non-invasive way of obtaining information about the presence, location and extent of calcified plaque in the coronary arteries--the vessels that supply oxygen-containing blood to the heart muscle. Calcified plaque results when there is a build-up of fat and other substances under the inner layer of the artery. This material can calcify which signals the presence of atherosclerosis, a disease of the vessel wall, also called coronary artery disease (CAD). People with this disease have an increased risk for heart attacks. In addition, over time, progression of plaque build up (CAD) can narrow the arteries or even close off blood flow to the heart. The result may be chest pain, sometimes called "angina," or a heart attack. Because calcium is a marker of CAD, the amount of calcium detected on a cardiac CT scan is a helpful prognostic tool. The findings on cardiac CT are expressed as a calcium score. Another name for this test is coronary artery calcium scoring.  What are some common uses of the procedure? The goal of cardiac CT scan for calcium scoring is to determine if CAD is present and to what extent, even if there are no symptoms. It is a screening study that may be recommended by a physician for  patients with risk factors for CAD but no clinical symptoms. The major risk factors for CAD are: . high blood cholesterol levels  . family history of heart attacks  . diabetes  . high blood pressure  . cigarette smoking  . overweight or obese  . physical inactivity   A negative cardiac CT scan for calcium scoring shows no calcification within the coronary arteries. This suggests that CAD is absent or so minimal it cannot be seen by this technique. The chance of having a heart attack over the next two to five years is very low under these circumstances. A positive test means that CAD is present, regardless of whether or not the patient is experiencing any symptoms. The amount of calcification--expressed as the calcium score--may help to predict the likelihood of a myocardial infarction (heart attack) in the coming years and helps your medical doctor or cardiologist decide whether the patient may need to take preventive medicine or undertake other measures such as diet and exercise to lower the risk for heart attack. The extent of CAD is graded according to your calcium score:  Calcium Score  Presence of CAD (coronary artery disease)  0 No evidence of CAD   1-10 Minimal evidence of CAD  11-100 Mild evidence of CAD  101-400 Moderate evidence of CAD  Over 400 Extensive evidence of CAD    You can try Lion's Mane Mushroom capsules for memory, focus, neuropathy ([employer] or [employer])

## 2020-03-01 NOTE — Assessment & Plan Note (Signed)
On botox inj Bupap

## 2020-03-01 NOTE — Progress Notes (Signed)
Subjective:  Patient ID: Whitney [name], female    DOB: [birth year]  Age: 64 y.o. MRN: [id number]  CC: New Patient (Initial Visit)   HPI Whitney [name] presents for a new pt visit -well exam She is post-recent COVID infection.  She continues to have asthmatic cough, worse at nighttime.  She is status post treatment with hydroxychloroquine and monoclonal antibodies.    Outpatient Medications Prior to Visit  Medication Sig Dispense Refill  . albuterol (VENTOLIN HFA) 108 (90 Base) MCG/ACT inhaler Inhale 2 puffs into the lungs every 4 (four) hours as needed for wheezing or shortness of breath. 18 g 5  . azithromycin (ZITHROMAX Z-PAK) 250 MG tablet As directed 6 tablet 0  . benzonatate (TESSALON) 200 MG capsule Take 1 capsule (200 mg total) by mouth 3 (three) times daily as needed for cough. 30 capsule 1  . Calcium Carbonate-Vitamin D (CALCIUM 600 + D PO) Take 2 tablets by mouth daily.    . Efinaconazole (JUBLIA) 10 % SOLN Apply 1 troponin each affected toenail once daily as instructed for 12 months 8 mL 2  . fluticasone (FLONASE) 50 MCG/ACT nasal spray Place 2 sprays into the nose daily.    . fluticasone furoate-vilanterol (BREO ELLIPTA) 100-25 MCG/INH AEPB Inhale 1 puff into the lungs daily. 1 each 1  . hydroxychloroquine (PLAQUENIL) 200 MG tablet Take 400 mg bid x 2 days, then 200 mg daily for 5 days for fever, cough, shortness of breath 13 tablet 0  . ibuprofen (ADVIL,MOTRIN) 200 MG tablet Take 800 mg by mouth every 6 (six) hours as needed for pain or headache.    . LUTEIN PO Take 1 capsule by mouth daily.    . orphenadrine (NORFLEX) 100 MG tablet     . pantoprazole (PROTONIX) 40 MG tablet Take 40 mg by mouth daily.    . predniSONE (STERAPRED UNI-PAK) 10 MG tablet     . traMADol (ULTRAM) 50 MG tablet Take 1-2 tablets (50-100 mg total) by mouth every 6 (six) hours as needed. 40 tablet 0  . zolmitriptan (ZOMIG-ZMT) 5 MG disintegrating tablet Take 5 mg by mouth as needed for  migraine.      No facility-administered medications prior to visit.    ROS: Review of Systems  Constitutional: Negative for activity change, appetite change, chills, fatigue and unexpected weight change.  HENT: Negative for congestion, mouth sores and sinus pressure.   Eyes: Negative for visual disturbance.  Respiratory: Positive for cough. Negative for chest tightness and shortness of breath.   Cardiovascular: Negative for chest pain.  Gastrointestinal: Negative for abdominal pain and nausea.  Genitourinary: Negative for difficulty urinating, frequency and vaginal pain.  Musculoskeletal: Positive for arthralgias. Negative for back pain and gait problem.  Skin: Negative for pallor and rash.  Neurological: Negative for dizziness, tremors, weakness, numbness and headaches.  Psychiatric/Behavioral: Negative for confusion and sleep disturbance.    Objective:  BP (!) 160/80   Pulse 86   Temp 98.3 F (36.8 C) (Oral)   Ht 5\' 6"  (1.676 m)   Wt 140 lb (63.5 kg)   SpO2 97%   BMI 22.60 kg/m   BP Readings from Last 3 Encounters:  03/01/20 (!) 160/80  01/27/20 140/73  09/11/13 109/68    Wt Readings from Last 3 Encounters:  03/01/20 140 lb (63.5 kg)  06/03/12 146 lb (66.2 kg)  05/13/12 143 lb 6.4 oz (65 kg)    Physical Exam  Lab Results  Component Value Date   WBC 6.1  12/11/2008   HGB 13.7 12/11/2008   HCT 39.4 12/11/2008   PLT 155 12/11/2008   GLUCOSE 95 12/11/2008   ALT 33 12/11/2008   AST 35 12/11/2008   NA 139 12/11/2008   K 4.2 12/11/2008   CL 105 12/11/2008   CREATININE 0.8 12/11/2008   BUN 18 12/11/2008   CO2 29 12/11/2008    No results found.  Assessment & Plan:     Sonda Primes, MD

## 2020-03-02 DIAGNOSIS — U071 COVID-19: Secondary | ICD-10-CM | POA: Insufficient documentation

## 2020-03-02 NOTE — Assessment & Plan Note (Signed)
She is post-recent COVID infection.  She continues to have asthmatic cough, worse at nighttime.  She is status post treatment with hydroxychloroquine and monoclonal antibodies.

## 2020-03-02 NOTE — Assessment & Plan Note (Signed)
Tussionex as needed Continue with Breo Ellipta daily and albuterol

## 2020-03-02 NOTE — Assessment & Plan Note (Addendum)
  We discussed age appropriate health related issues, including available/recomended screening tests and vaccinations. Labs were ordered to be later reviewed . All questions were answered. We discussed one or more of the following - seat belt use, use of sunscreen/sun exposure exercise, safe sex, fall risk reduction, second hand smoke exposure, firearm use and storage, seat belt use, a need for adhering to healthy diet and exercise. Labs were ordered.  All questions were answered. Coronary calcium CT offered-order placed

## 2020-03-24 ENCOUNTER — Encounter: Payer: Self-pay | Admitting: Internal Medicine

## 2020-03-29 DIAGNOSIS — G43719 Chronic migraine without aura, intractable, without status migrainosus: Secondary | ICD-10-CM | POA: Diagnosis not present

## 2020-04-13 DIAGNOSIS — G43709 Chronic migraine without aura, not intractable, without status migrainosus: Secondary | ICD-10-CM | POA: Diagnosis not present

## 2020-04-14 DIAGNOSIS — M25562 Pain in left knee: Secondary | ICD-10-CM | POA: Diagnosis not present

## 2020-04-22 DIAGNOSIS — M7122 Synovial cyst of popliteal space [Baker], left knee: Secondary | ICD-10-CM | POA: Diagnosis not present

## 2020-05-06 DIAGNOSIS — Z23 Encounter for immunization: Secondary | ICD-10-CM | POA: Diagnosis not present

## 2020-05-06 DIAGNOSIS — Z6825 Body mass index (BMI) 25.0-25.9, adult: Secondary | ICD-10-CM | POA: Diagnosis not present

## 2020-05-06 DIAGNOSIS — Z01419 Encounter for gynecological examination (general) (routine) without abnormal findings: Secondary | ICD-10-CM | POA: Diagnosis not present

## 2020-06-01 DIAGNOSIS — M7122 Synovial cyst of popliteal space [Baker], left knee: Secondary | ICD-10-CM | POA: Diagnosis not present

## 2020-06-28 DIAGNOSIS — G43719 Chronic migraine without aura, intractable, without status migrainosus: Secondary | ICD-10-CM | POA: Diagnosis not present

## 2020-07-15 ENCOUNTER — Other Ambulatory Visit: Payer: Self-pay | Admitting: Cardiology

## 2020-07-15 ENCOUNTER — Other Ambulatory Visit (HOSPITAL_COMMUNITY): Payer: Self-pay | Admitting: Cardiology

## 2020-07-15 DIAGNOSIS — Z9189 Other specified personal risk factors, not elsewhere classified: Secondary | ICD-10-CM

## 2020-07-29 ENCOUNTER — Inpatient Hospital Stay: Admission: RE | Admit: 2020-07-29 | Payer: Self-pay | Source: Ambulatory Visit

## 2020-08-02 ENCOUNTER — Other Ambulatory Visit: Payer: Self-pay

## 2020-08-02 ENCOUNTER — Ambulatory Visit
Admission: RE | Admit: 2020-08-02 | Discharge: 2020-08-02 | Disposition: A | Discharge status: Home / Self Care | Payer: Self-pay | Source: Ambulatory Visit | Attending: Cardiology | Admitting: Cardiology

## 2020-08-02 DIAGNOSIS — Z9189 Other specified personal risk factors, not elsewhere classified: Secondary | ICD-10-CM

## 2020-09-07 DIAGNOSIS — G43709 Chronic migraine without aura, not intractable, without status migrainosus: Secondary | ICD-10-CM | POA: Diagnosis not present

## 2020-09-22 DIAGNOSIS — G43719 Chronic migraine without aura, intractable, without status migrainosus: Secondary | ICD-10-CM | POA: Diagnosis not present

## 2020-10-25 ENCOUNTER — Ambulatory Visit
Admission: RE | Admit: 2020-10-25 | Discharge: 2020-10-25 | Disposition: A | Discharge status: Home / Self Care | Payer: BC Managed Care – PPO | Source: Ambulatory Visit | Attending: Obstetrics and Gynecology | Admitting: Obstetrics and Gynecology

## 2020-10-25 ENCOUNTER — Other Ambulatory Visit: Payer: Self-pay | Admitting: Obstetrics and Gynecology

## 2020-10-25 ENCOUNTER — Other Ambulatory Visit: Payer: Self-pay

## 2020-10-25 DIAGNOSIS — Z1231 Encounter for screening mammogram for malignant neoplasm of breast: Secondary | ICD-10-CM | POA: Diagnosis not present

## 2020-10-28 ENCOUNTER — Ambulatory Visit: Payer: BC Managed Care – PPO

## 2020-12-12 DIAGNOSIS — G43709 Chronic migraine without aura, not intractable, without status migrainosus: Secondary | ICD-10-CM | POA: Diagnosis not present

## 2020-12-20 DIAGNOSIS — G43719 Chronic migraine without aura, intractable, without status migrainosus: Secondary | ICD-10-CM | POA: Diagnosis not present

## 2021-03-01 DIAGNOSIS — G43719 Chronic migraine without aura, intractable, without status migrainosus: Secondary | ICD-10-CM | POA: Diagnosis not present

## 2021-03-01 DIAGNOSIS — H524 Presbyopia: Secondary | ICD-10-CM | POA: Diagnosis not present

## 2021-03-28 DIAGNOSIS — G43719 Chronic migraine without aura, intractable, without status migrainosus: Secondary | ICD-10-CM | POA: Diagnosis not present

## 2021-04-13 DIAGNOSIS — J310 Chronic rhinitis: Secondary | ICD-10-CM | POA: Diagnosis not present

## 2021-04-13 DIAGNOSIS — J0101 Acute recurrent maxillary sinusitis: Secondary | ICD-10-CM | POA: Diagnosis not present

## 2021-04-13 DIAGNOSIS — J343 Hypertrophy of nasal turbinates: Secondary | ICD-10-CM | POA: Diagnosis not present

## 2021-05-02 DIAGNOSIS — H524 Presbyopia: Secondary | ICD-10-CM | POA: Diagnosis not present

## 2021-05-08 DIAGNOSIS — Z01419 Encounter for gynecological examination (general) (routine) without abnormal findings: Secondary | ICD-10-CM | POA: Diagnosis not present

## 2021-05-08 DIAGNOSIS — R519 Headache, unspecified: Secondary | ICD-10-CM | POA: Diagnosis not present

## 2021-05-08 DIAGNOSIS — Z6825 Body mass index (BMI) 25.0-25.9, adult: Secondary | ICD-10-CM | POA: Diagnosis not present

## 2021-05-08 DIAGNOSIS — Z23 Encounter for immunization: Secondary | ICD-10-CM | POA: Diagnosis not present

## 2021-05-15 ENCOUNTER — Ambulatory Visit: Payer: BC Managed Care – PPO | Admitting: Internal Medicine

## 2021-05-15 ENCOUNTER — Other Ambulatory Visit: Payer: Self-pay

## 2021-05-15 ENCOUNTER — Encounter: Payer: Self-pay | Admitting: Internal Medicine

## 2021-05-15 ENCOUNTER — Ambulatory Visit (INDEPENDENT_AMBULATORY_CARE_PROVIDER_SITE_OTHER): Payer: BC Managed Care – PPO

## 2021-05-15 DIAGNOSIS — D509 Iron deficiency anemia, unspecified: Secondary | ICD-10-CM | POA: Insufficient documentation

## 2021-05-15 DIAGNOSIS — R059 Cough, unspecified: Secondary | ICD-10-CM | POA: Diagnosis not present

## 2021-05-15 DIAGNOSIS — K625 Hemorrhage of anus and rectum: Secondary | ICD-10-CM | POA: Insufficient documentation

## 2021-05-15 DIAGNOSIS — J45991 Cough variant asthma: Secondary | ICD-10-CM | POA: Diagnosis not present

## 2021-05-15 DIAGNOSIS — R142 Eructation: Secondary | ICD-10-CM | POA: Insufficient documentation

## 2021-05-15 DIAGNOSIS — R141 Gas pain: Secondary | ICD-10-CM | POA: Insufficient documentation

## 2021-05-15 DIAGNOSIS — R102 Pelvic and perineal pain: Secondary | ICD-10-CM | POA: Insufficient documentation

## 2021-05-15 MED ORDER — HYDROCODONE BIT-HOMATROP MBR 5-1.5 MG/5ML PO SOLN: 5.0000 mL | Freq: Four times a day (QID) | ORAL | 0 refills | Status: DC | PRN

## 2021-05-15 MED ORDER — TRELEGY ELLIPTA 100-62.5-25 MCG/ACT IN AEPB: 1.0000 | INHALATION_SPRAY | Freq: Every day | RESPIRATORY_TRACT | 5 refills | Status: DC

## 2021-05-15 NOTE — Progress Notes (Unsigned)
Subjective:  Patient ID: Eloise Levels, female    DOB: 1955/06/02  Age: 66 y.o. MRN: [id number]  CC: Cough (Bronchitis in February, dry cough has persisted. )   HPI Chales Abrahams [place] presents for sinusitis and bronchitis C/o persistent cough worse w/talking, cold air  Outpatient Medications Prior to Visit  Medication Sig Dispense Refill   albuterol (VENTOLIN HFA) 108 (90 Base) MCG/ACT inhaler Inhale 2 puffs into the lungs every 4 (four) hours as needed for wheezing or shortness of breath. 18 g 5   butalbital-acetaminophen-caffeine (FIORICET) 50-325-40 MG tablet butalbital-acetaminophen-caffeine 50 mg-325 mg-40 mg tablet  Take 1 tablet every 4 hours by oral route.     Calcium Carbonate-Vitamin D (CALCIUM 600 + D PO) Take 2 tablets by mouth daily.     Cholecalciferol (VITAMIN D3) 50 MCG (2000 UT) capsule Take 1 capsule (2,000 Units total) by mouth daily. 100 capsule 3   diazepam (VALIUM) 2 MG tablet TAKE 1 TABLET BY MOUTH 1 HOUR PRIOR TO PROCEDURE.  MAX DAILY DOSES     Efinaconazole (JUBLIA) 10 % SOLN Apply 1 troponin each affected toenail once daily as instructed for 12 months 8 mL 2   fluticasone (FLONASE) 50 MCG/ACT nasal spray Place 2 sprays into the nose daily.     fluticasone furoate-vilanterol (BREO ELLIPTA) 100-25 MCG/INH AEPB Inhale 1 puff into the lungs daily. 1 each 1   levofloxacin (LEVAQUIN) 500 MG tablet Take 500 mg by mouth daily.     LUTEIN PO Take 1 capsule by mouth daily.     pantoprazole (PROTONIX) 40 MG tablet Take 40 mg by mouth daily.     zolmitriptan (ZOMIG-ZMT) 5 MG disintegrating tablet Take 5 mg by mouth as needed for migraine.      benzonatate (TESSALON) 200 MG capsule Take 1 capsule (200 mg total) by mouth 3 (three) times daily as needed for cough. 30 capsule 1   chlorpheniramine-HYDROcodone (TUSSIONEX PENNKINETIC ER) 10-8 MG/5ML SUER Take 5 mLs by mouth every 12 (twelve) hours as needed for cough. 115 mL 0   No facility-administered medications  prior to visit.    ROS: Review of Systems  Objective:  BP 138/72 (BP Location: Left Arm, Patient Position: Sitting, Cuff Size: Large)    Pulse 79    Temp 99.1 F (37.3 C) (Oral)    Ht 5\' 6"  (1.676 m)    Wt 148 lb 3.2 oz (67.2 kg)    SpO2 98%    BMI 23.92 kg/m   BP Readings from Last 3 Encounters:  05/15/21 138/72  03/01/20 (!) 160/80  01/27/20 140/73    Wt Readings from Last 3 Encounters:  05/15/21 148 lb 3.2 oz (67.2 kg)  03/01/20 140 lb (63.5 kg)  06/03/12 146 lb (66.2 kg)    Physical Exam  Lab Results  Component Value Date   WBC 6.1 12/11/2008   HGB 13.7 12/11/2008   HCT 39.4 12/11/2008   PLT 155 12/11/2008   GLUCOSE 95 12/11/2008   ALT 33 12/11/2008   AST 35 12/11/2008   NA 139 12/11/2008   K 4.2 12/11/2008   CL 105 12/11/2008   CREATININE 0.8 12/11/2008   BUN 18 12/11/2008   CO2 29 12/11/2008    MM 3D SCREEN BREAST BILATERAL  Result Date: 10/25/2020 CLINICAL DATA:  Screening. EXAM: DIGITAL SCREENING BILATERAL MAMMOGRAM WITH TOMOSYNTHESIS AND CAD TECHNIQUE: Bilateral screening digital craniocaudal and mediolateral oblique mammograms were obtained. Bilateral screening digital breast tomosynthesis was performed. The images were evaluated with computer-aided detection. COMPARISON:  Previous exam(s). ACR Breast Density Category c: The breast tissue is heterogeneously dense, which may obscure small masses. FINDINGS: There are no findings suspicious for malignancy. IMPRESSION: No mammographic evidence of malignancy. A result letter of this screening mammogram will be mailed directly to the patient. RECOMMENDATION: Screening mammogram in one year. (Code:SM-B-01Y) BI-RADS CATEGORY  1: Negative. Electronically Signed   By: Amie Portland M.D.   On: 10/25/2020 12:38    Assessment & Plan:   Problem List Items Addressed This Visit     Cough variant asthma    A flare up Hycodan  Trelegy qd CXR         No orders of the defined types were placed in this encounter.      Follow-up: No follow-ups on file.  Sonda Primes, MD

## 2021-05-15 NOTE — Assessment & Plan Note (Signed)
A flare up Hycodan  Trelegy qd CXR

## 2021-05-15 NOTE — Patient Instructions (Signed)
Try Align.

## 2021-06-07 DIAGNOSIS — M7122 Synovial cyst of popliteal space [Baker], left knee: Secondary | ICD-10-CM | POA: Diagnosis not present

## 2021-06-14 DIAGNOSIS — G43719 Chronic migraine without aura, intractable, without status migrainosus: Secondary | ICD-10-CM | POA: Diagnosis not present

## 2021-06-14 DIAGNOSIS — G43709 Chronic migraine without aura, not intractable, without status migrainosus: Secondary | ICD-10-CM | POA: Diagnosis not present

## 2021-06-19 DIAGNOSIS — M25511 Pain in right shoulder: Secondary | ICD-10-CM | POA: Diagnosis not present

## 2021-06-19 DIAGNOSIS — M25512 Pain in left shoulder: Secondary | ICD-10-CM | POA: Diagnosis not present

## 2021-06-21 ENCOUNTER — Encounter: Payer: Self-pay | Admitting: Internal Medicine

## 2021-06-27 DIAGNOSIS — G43719 Chronic migraine without aura, intractable, without status migrainosus: Secondary | ICD-10-CM | POA: Diagnosis not present

## 2021-06-29 ENCOUNTER — Encounter: Payer: Self-pay | Admitting: Podiatry

## 2021-06-29 ENCOUNTER — Ambulatory Visit: Payer: BC Managed Care – PPO | Admitting: Podiatry

## 2021-06-29 DIAGNOSIS — L603 Nail dystrophy: Secondary | ICD-10-CM

## 2021-06-29 NOTE — Progress Notes (Signed)
?Subjective:  ?Patient ID: Whitney [name], female    DOB: Jul 03, 1955,  MRN: [id number] ?HPI ?Chief Complaint  ?Patient presents with  ? Nail Problem  ?  Hallux bilateral - thick and discolored nails x years, injury previously to both, had pathology done-doesn't think its a fungus-tried lamisil in the past   ? New Patient (Initial Visit)  ?  Est pt 2015  ? ? ?66 y.o. female presents with the above complaint.  ? ?ROS: Denies fever chills nausea vomiting muscle aches pains calf pain back pain chest pain shortness of breath. ? ?Past Medical History:  ?Diagnosis Date  ? Allergic rhinitis   ? Bronchitis   ? Cough variant asthma   ? Gastritis   ? GERD (gastroesophageal reflux disease)   ? Migraine   ? Personal history of colonic polyps   ? ?Past Surgical History:  ?Procedure Laterality Date  ? BACK SURGERY    ? BREAST BIOPSY Left   ? DILATION AND CURETTAGE OF UTERUS    ? NECK SURGERY    ? SEPTOPLASTY    ? TRIGGER FINGER RELEASE    ? ? ?Current Outpatient Medications:  ?  albuterol (VENTOLIN HFA) 108 (90 Base) MCG/ACT inhaler, Inhale 2 puffs into the lungs every 4 (four) hours as needed for wheezing or shortness of breath., Disp: 18 g, Rfl: 5 ?  butalbital-acetaminophen-caffeine (FIORICET) 50-325-40 MG tablet, butalbital-acetaminophen-caffeine 50 mg-325 mg-40 mg tablet  Take 1 tablet every 4 hours by oral route., Disp: , Rfl:  ?  Calcium Carbonate-Vitamin D (CALCIUM 600 + D PO), Take 2 tablets by mouth daily., Disp: , Rfl:  ?  Cholecalciferol (VITAMIN D3) 50 MCG (2000 UT) capsule, Take 1 capsule (2,000 Units total) by mouth daily., Disp: 100 capsule, Rfl: 3 ?  diazepam (VALIUM) 2 MG tablet, TAKE 1 TABLET BY MOUTH 1 HOUR PRIOR TO PROCEDURE.  Venessa Wickham DAILY DOSES, Disp: , Rfl:  ?  Efinaconazole (JUBLIA) 10 % SOLN, Apply 1 troponin each affected toenail once daily as instructed for 12 months, Disp: 8 mL, Rfl: 2 ?  fluticasone (FLONASE) 50 MCG/ACT nasal spray, Place 2 sprays into the nose daily., Disp: , Rfl:  ?   fluticasone furoate-vilanterol (BREO ELLIPTA) 100-25 MCG/INH AEPB, Inhale 1 puff into the lungs daily., Disp: 1 each, Rfl: 1 ?  Fluticasone-Umeclidin-Vilant (TRELEGY ELLIPTA) 100-62.5-25 MCG/ACT AEPB, Inhale 1 puff into the lungs daily., Disp: 1 each, Rfl: 5 ?  HYDROcodone bit-homatropine (HYCODAN) 5-1.5 MG/5ML syrup, Take 5 mLs by mouth every 6 (six) hours as needed for cough., Disp: 240 mL, Rfl: 0 ?  levofloxacin (LEVAQUIN) 500 MG tablet, Take 500 mg by mouth daily., Disp: , Rfl:  ?  LUTEIN PO, Take 1 capsule by mouth daily., Disp: , Rfl:  ?  pantoprazole (PROTONIX) 40 MG tablet, Take 40 mg by mouth daily., Disp: , Rfl:  ?  zolmitriptan (ZOMIG-ZMT) 5 MG disintegrating tablet, Take 5 mg by mouth as needed for migraine. , Disp: , Rfl:  ? ?Allergies  ?Allergen Reactions  ? Sulfonamide Derivatives   ?  REACTION: rash  ? Sulfa Antibiotics Rash  ? ?Review of Systems ?Objective:  ?There were no vitals filed for this visit. ? ?General: Well developed, nourished, in no acute distress, alert and oriented x3  ? ?Dermatological: Skin is warm, dry and supple bilateral. Nails x 10 are well maintained; remaining integument appears unremarkable at this time. There are no open sores, no preulcerative lesions, no rash or signs of infection present.  Nail dystrophy hallux  bilateral right hallux nail plate is thickened but not discolored.  Left hallux nail plate is thickened discolored and malaligned and does demonstrate distal onycholysis. ? ?Vascular: Dorsalis Pedis artery and Posterior Tibial artery pedal pulses are 2/4 bilateral with immedate capillary fill time. Pedal hair growth present. No varicosities and no lower extremity edema present bilateral.  ? ?Neruologic: Grossly intact via light touch bilateral. Vibratory intact via tuning fork bilateral. Protective threshold with Semmes Wienstein monofilament intact to all pedal sites bilateral. Patellar and Achilles deep tendon reflexes 2+ bilateral. No Babinski or clonus noted  bilateral.  ? ?Musculoskeletal: No gross boney pedal deformities bilateral. No pain, crepitus, or limitation noted with foot and ankle range of motion bilateral. Muscular strength 5/5 in all groups tested bilateral. ? ?Gait: Unassisted, Nonantalgic.  ? ? ?Radiographs: ? ?None taken ? ?Assessment & Plan:  ? ?Assessment: Nail dystrophy hallux bilateral left greater than right remainder of the nails are in good shape. ? ?Plan: We discussed etiology pathology conservative versus surgical therapies.  At this point we decided to try to keep the nailbed moist enough hoping for reappearance of the lower level of the nails.  As long as the nail continues to grow she is going to try growth factors that she has at her office mixed with small amount of Aquaphor to help keep the nailbed moist.  She will keep [place] abreast of her progress. ? ? ? ? ?Yemaya Barnier T. Buckner, DPM ?

## 2021-07-02 DIAGNOSIS — M25511 Pain in right shoulder: Secondary | ICD-10-CM | POA: Diagnosis not present

## 2021-07-02 DIAGNOSIS — M25512 Pain in left shoulder: Secondary | ICD-10-CM | POA: Diagnosis not present

## 2021-07-05 DIAGNOSIS — Z13820 Encounter for screening for osteoporosis: Secondary | ICD-10-CM | POA: Diagnosis not present

## 2021-07-05 DIAGNOSIS — M25512 Pain in left shoulder: Secondary | ICD-10-CM | POA: Diagnosis not present

## 2021-07-05 DIAGNOSIS — M25511 Pain in right shoulder: Secondary | ICD-10-CM | POA: Diagnosis not present

## 2021-07-11 DIAGNOSIS — M25511 Pain in right shoulder: Secondary | ICD-10-CM | POA: Diagnosis not present

## 2021-07-11 DIAGNOSIS — M25512 Pain in left shoulder: Secondary | ICD-10-CM | POA: Diagnosis not present

## 2021-07-17 DIAGNOSIS — M25511 Pain in right shoulder: Secondary | ICD-10-CM | POA: Diagnosis not present

## 2021-07-17 DIAGNOSIS — M25512 Pain in left shoulder: Secondary | ICD-10-CM | POA: Diagnosis not present

## 2021-07-19 DIAGNOSIS — M25511 Pain in right shoulder: Secondary | ICD-10-CM | POA: Diagnosis not present

## 2021-07-19 DIAGNOSIS — M25512 Pain in left shoulder: Secondary | ICD-10-CM | POA: Diagnosis not present

## 2021-07-19 DIAGNOSIS — G5603 Carpal tunnel syndrome, bilateral upper limbs: Secondary | ICD-10-CM | POA: Diagnosis not present

## 2021-07-21 DIAGNOSIS — G5603 Carpal tunnel syndrome, bilateral upper limbs: Secondary | ICD-10-CM | POA: Diagnosis not present

## 2021-08-07 DIAGNOSIS — M7542 Impingement syndrome of left shoulder: Secondary | ICD-10-CM | POA: Diagnosis not present

## 2021-11-30 ENCOUNTER — Ambulatory Visit (INDEPENDENT_AMBULATORY_CARE_PROVIDER_SITE_OTHER): Payer: Medicare Other | Admitting: Neurology

## 2021-11-30 ENCOUNTER — Encounter: Payer: Self-pay | Admitting: Neurology

## 2021-11-30 ENCOUNTER — Telehealth: Payer: Self-pay | Admitting: *Deleted

## 2021-11-30 VITALS — BP 135/71 | HR 77 | Ht 66.0 in | Wt 152.4 lb

## 2021-11-30 DIAGNOSIS — G43709 Chronic migraine without aura, not intractable, without status migrainosus: Secondary | ICD-10-CM

## 2021-11-30 NOTE — Telephone Encounter (Signed)
-----   Message from Melvenia Beam, MD sent at 11/30/2021  8:40 AM EDT ----- Regarding: please start botox approval Please start botox transfer of care. She is medicare. Her last botox was 8/14 so I scheduled her for 12 weeks out 11/6

## 2021-11-30 NOTE — Progress Notes (Signed)
[facility]    Provider:  Dr Lucia Gaskins Requesting Provider: Elouise Munroe, MD Primary Care Provider:  Pcp, No  CC:  migraines  HPI:  Bunnie Domino Bastien is a 66 y.o. female here as requested by Elouise Munroe, MD for migraines. PMHx migraines, asthma. She is a Engineer, petroleum. Before botox having more than 20 migraines a month and daily headaches. After botox >>60% reduction in freq and severity of migraines and headaches,  zomig works for the 4-5 remaining migraines < 10 total headache days a month remaining and butalbital and zomig help, a lot of neck tightness.  Last 10/16/2021. Stable, no changes.  Her migraines can be unilateral, pulsating pounding throbbing, light and sound sensitivity, nausea, hurts to move, no medication overuse, no aura, they can last 4 to 72 hours, we will request transfer of care for her Botox to our practice.No other focal neurologic deficits, associated symptoms, inciting events or modifiable factors.  Reviewed notes, labs and imaging from outside physicians, which showed:  CT Head 06/17/2012 showed No acute intracranial abnormalities including mass lesion or mass effect, hydrocephalus, extra-axial fluid collection, midline shift, hemorrhage, or acute infarction, large ischemic events (personally reviewed images)  Reviewed Dr. Fredia Sorrow notes from 06/27/2021 which was her last botox: Current and past medications: ANALGESICS: Tylenol, Aspririn, Fiorinal, fioricet ANTI-MIGRAINE: Imitrex, Maxalt, Midrin, Zomig, Bernita Raisin  DECONGESTANT/ANTIHISTAMINE: Flonase, Sudafed  ANTI-NAUSEANT: Phenergan, Zofran  NSAIDS: Advil, Aleve, Orudis, Nuprin, Voltaren  MUSCLE RELAXANTS: norgesic forte, parafon forte, skelaxin  ANTI-CONVULSANTS: Topamax  SLEEPING PILLS/TRANQUILIZERS: Ambien, Halcion, Restoril  ANTI-DEPRESSANTS: Amitriptyline, Effexor  PROCEDURES FOR HEADACHES: Botox Blood Pressure: propranolol comtraindicated(See below)  Also, per chart  review, propranolol contraindicated due to asthma (has an albuterol inhaler)  Review of Systems: Patient complains of symptoms per HPI as well as the following symptoms migraines. Pertinent negatives and positives per HPI. All others negative.   Social History   Socioeconomic History   Marital status: Widowed    Spouse name: Not on file   Number of children: Not on file   Years of education: Not on file   Highest education level: Not on file  Occupational History   Occupation: Engineer, petroleum  Tobacco Use   Smoking status: Never   Smokeless tobacco: Never  Substance and Sexual Activity   Alcohol use: Yes    Comment: social   Drug use: No   Sexual activity: Not on file  Other Topics Concern   Not on file  Social History Narrative   ** Merged History Encounter **       Married   Lives with husband   No biological children, does have stepchildren   Social Determinants of Corporate investment banker Strain: Not on file  Food Insecurity: Not on file  Transportation Needs: Not on file  Physical Activity: Not on file  Stress: Not on file  Social Connections: Not on file  Intimate Partner Violence: Not on file    Family History  Problem Relation Age of Onset   Hypertension Mother    Arthritis Mother    Migraines Mother    Hypertension Father    Heart disease Father    Heart attack Father    Hypertension Sister    Migraines Sister    Lupus Maternal Aunt    Breast cancer Maternal Aunt    Prostate cancer Maternal Uncle    Hypertension Other        siblings x2    Past Medical History:  Diagnosis Date  Allergic rhinitis    Bronchitis    Cough variant asthma    Gastritis    GERD (gastroesophageal reflux disease)    Migraine    Personal history of colonic polyps     Patient Active Problem List   Diagnosis Date Noted   Flatulence, eructation and gas pain 05/15/2021   Iron deficiency anemia 05/15/2021   Pelvic and perineal pain 05/15/2021   Rectal bleeding  05/15/2021   Synovial cyst of left popliteal space 06/01/2020   COVID-19 03/02/2020   Well adult exam 03/01/2020   Migraines 03/01/2020   Colon polyp 03/01/2020   Bite wound 10/15/2014   Chronic migraine without aura without status migrainosus, not intractable 11/15/2012   SHINGLES 11/08/2009   COLONIC POLYPS, HYPERPLASTIC, HX OF 11/08/2009   ALLERGIC RHINITIS 09/30/2008   Cough variant asthma 09/30/2008   GERD 09/30/2008   HEADACHE, CHRONIC 09/30/2008    Past Surgical History:  Procedure Laterality Date   BACK SURGERY     BREAST BIOPSY Left    DILATION AND CURETTAGE OF UTERUS     NECK SURGERY     SEPTOPLASTY     TRIGGER FINGER RELEASE      Current Outpatient Medications  Medication Sig Dispense Refill   albuterol (VENTOLIN HFA) 108 (90 Base) MCG/ACT inhaler Inhale 2 puffs into the lungs every 4 (four) hours as needed for wheezing or shortness of breath. 18 g 5   butalbital-acetaminophen-caffeine (FIORICET) 50-325-40 MG tablet butalbital-acetaminophen-caffeine 50 mg-325 mg-40 mg tablet  Take 1 tablet every 4 hours by oral route.     Calcium Carbonate-Vitamin D (CALCIUM 600 + D PO) Take 2 tablets by mouth daily.     Cholecalciferol (VITAMIN D3) 50 MCG (2000 UT) capsule Take 1 capsule (2,000 Units total) by mouth daily. 100 capsule 3   fluticasone (FLONASE) 50 MCG/ACT nasal spray Place 2 sprays into the nose as needed.     fluticasone furoate-vilanterol (BREO ELLIPTA) 100-25 MCG/INH AEPB Inhale 1 puff into the lungs daily. 1 each 1   LUTEIN PO Take 1 capsule by mouth daily.     pantoprazole (PROTONIX) 40 MG tablet Take 40 mg by mouth as needed.     zolmitriptan (ZOMIG-ZMT) 5 MG disintegrating tablet Take 5 mg by mouth as needed for migraine.      No current facility-administered medications for this visit.    Allergies as of 11/30/2021 - Review Complete 11/30/2021  Allergen Reaction Noted   Sulfonamide derivatives     Sulfa antibiotics Rash 09/11/2013    Vitals: BP  135/71   Pulse 77   Ht 5\' 6"  (1.676 m)   Wt 152 lb 6.4 oz (69.1 kg)   BMI 24.60 kg/m  Last Weight:  Wt Readings from Last 1 Encounters:  11/30/21 152 lb 6.4 oz (69.1 kg)   Last Height:   Ht Readings from Last 1 Encounters:  11/30/21 5\' 6"  (1.676 m)     Physical exam: Exam: Gen: NAD, conversant, well nourised, well groomed                     CV: RRR, no MRG. No Carotid Bruits. No peripheral edema, warm, nontender Eyes: Conjunctivae clear without exudates or hemorrhage  Neuro: Detailed Neurologic Exam  Speech:    Speech is normal; fluent and spontaneous with normal comprehension.  Cognition:    The patient is oriented to person, place, and time;     recent and remote memory intact;     language fluent;  normal attention, concentration,     fund of knowledge Cranial Nerves:    The pupils are equal, round, and reactive to light. The fundi are normal and spontaneous venous pulsations are present. Visual fields are full to finger confrontation. Extraocular movements are intact. Trigeminal sensation is intact and the muscles of mastication are normal. The face is symmetric. The palate elevates in the midline. Hearing intact. Voice is normal. Shoulder shrug is normal. The tongue has normal motion without fasciculations.   Coordination:    Normal   Gait:    normal.   Motor Observation:    No asymmetry, no atrophy, and no involuntary movements noted. Tone:    Normal muscle tone.    Posture:    Posture is normal. normal erect    Strength:    Strength is V/V in the upper and lower limbs.      Sensation: intact to LT     Reflex Exam:  DTR's:    Deep tendon reflexes in the upper and lower extremities are normal bilaterally.   Toes:    The toes are downgoing bilaterally.   Clonus:    Clonus is absent.    Assessment/Plan:  66 y.o. female here as requested by Lowell Guitar, MD for migraines. PMHx migraines, asthma. She is a Psychiatric nurse. Before botox having  more than 20 migraines a month and daily headaches. After botox >>60% reduction in freq and severity of migraines and headaches,  zomig works for the 4-5 remaining migraines < 10 total headache days a month remaining and butalbital and zomig help, a lot of neck tightness.  Last 10/16/2021. Stable, no changes.  Her migraines can be unilateral, pulsating pounding throbbing, light and sound sensitivity, nausea, hurts to move, no medication overuse, no aura, they can last 4 to 72 hours, we will request transfer of care for her Botox to our practice.No other focal neurologic deficits, associated symptoms, inciting events or modifiable factors.  Transfer of botox. Last was 8/14 she is scheduled for Nov 6th.   Cc: Lowell Guitar, MD,  Pcp, No  Sarina Ill, MD  [facility] [address] [place], [place] [address]  Phone [phone] Fax [phone]

## 2021-11-30 NOTE — Telephone Encounter (Signed)
Chronic Migraine CPT 64615  Botox J0585 Units:200  G43.709 Chronic Migraine without aura, not intractable, without status migrainous   

## 2021-11-30 NOTE — Patient Instructions (Addendum)
Continue Botox

## 2021-12-01 ENCOUNTER — Telehealth (HOSPITAL_COMMUNITY): Payer: Self-pay

## 2021-12-01 NOTE — Telephone Encounter (Signed)
BotoxOne verification submitted   Key #  [id number]

## 2021-12-04 ENCOUNTER — Other Ambulatory Visit (HOSPITAL_COMMUNITY): Payer: Self-pay

## 2021-12-05 ENCOUNTER — Other Ambulatory Visit (HOSPITAL_COMMUNITY): Payer: Self-pay

## 2021-12-05 NOTE — Telephone Encounter (Signed)
Botox Prior Authorization has been faxed to [facility].  Will update when we hear back from insurance  Whitney [name], [place] Patient [place] Patient Advocate Team Direct Number: [phone]  Fax: [phone]

## 2021-12-07 ENCOUNTER — Other Ambulatory Visit (HOSPITAL_COMMUNITY): Payer: Self-pay

## 2021-12-08 ENCOUNTER — Other Ambulatory Visit (HOSPITAL_COMMUNITY): Payer: Self-pay

## 2021-12-08 NOTE — Telephone Encounter (Signed)
Patient Advocate Encounter  Prior Authorization for Botox 200 units has been approved.    Reference Number# [id number] Effective dates: 12/05/2021 through 11/05/2022  Buy and Elton Sin, [place] Patient Advocate Specialist [place] Patient Advocate Team Direct Number: [phone]  Fax: [phone]

## 2021-12-11 NOTE — Telephone Encounter (Signed)
Noted thanks. Appt note has been updated.

## 2022-01-08 ENCOUNTER — Ambulatory Visit (INDEPENDENT_AMBULATORY_CARE_PROVIDER_SITE_OTHER): Payer: Medicare Other | Admitting: Neurology

## 2022-01-08 DIAGNOSIS — G43709 Chronic migraine without aura, not intractable, without status migrainosus: Secondary | ICD-10-CM | POA: Diagnosis not present

## 2022-01-08 MED ORDER — ONABOTULINUMTOXINA 200 UNITS IJ SOLR
155.0000 [IU] | Freq: Once | INTRAMUSCULAR | Status: AC
  Administered 2022-01-08: 155 [IU] via INTRAMUSCULAR

## 2022-01-08 NOTE — Progress Notes (Unsigned)
Botox- 200 units x 1 vial Lot: [id number] Expiration: 04/2024 NDC: 0023-3921-02  Bacteriostatic 0.9% Sodium Chloride- 4mL total Lot: [id number] Expiration: 11/04/2022 NDC: 0409-1966-02  Dx: G43.709 B/B  

## 2022-01-09 MED ORDER — BUTALBITAL-APAP-CAFFEINE 50-325-40 MG PO TABS: ORAL_TABLET | ORAL | 5 refills | Status: DC

## 2022-01-10 DIAGNOSIS — M25512 Pain in left shoulder: Secondary | ICD-10-CM | POA: Diagnosis not present

## 2022-01-30 ENCOUNTER — Encounter: Payer: Self-pay | Admitting: Medical

## 2022-02-02 ENCOUNTER — Encounter: Payer: Self-pay | Admitting: Medical

## 2022-02-05 ENCOUNTER — Other Ambulatory Visit: Payer: Self-pay | Admitting: Medical

## 2022-02-05 DIAGNOSIS — M79662 Pain in left lower leg: Secondary | ICD-10-CM

## 2022-02-09 ENCOUNTER — Ambulatory Visit
Admission: RE | Admit: 2022-02-09 | Discharge: 2022-02-09 | Disposition: A | Discharge status: Home / Self Care | Payer: Medicare Other | Source: Ambulatory Visit | Attending: Medical | Admitting: Medical

## 2022-02-09 DIAGNOSIS — R600 Localized edema: Secondary | ICD-10-CM | POA: Diagnosis not present

## 2022-02-09 DIAGNOSIS — M79662 Pain in left lower leg: Secondary | ICD-10-CM

## 2022-03-20 ENCOUNTER — Encounter: Payer: Self-pay | Admitting: Internal Medicine

## 2022-03-20 ENCOUNTER — Ambulatory Visit (INDEPENDENT_AMBULATORY_CARE_PROVIDER_SITE_OTHER): Payer: Medicare Other | Admitting: Internal Medicine

## 2022-03-20 VITALS — BP 140/80 | HR 85 | Temp 98.5°F | Ht 66.0 in | Wt 150.0 lb

## 2022-03-20 DIAGNOSIS — R053 Chronic cough: Secondary | ICD-10-CM

## 2022-03-20 DIAGNOSIS — J45991 Cough variant asthma: Secondary | ICD-10-CM | POA: Diagnosis not present

## 2022-03-20 DIAGNOSIS — R0602 Shortness of breath: Secondary | ICD-10-CM

## 2022-03-20 MED ORDER — BENZONATATE 200 MG PO CAPS: 200.0000 mg | ORAL_CAPSULE | Freq: Two times a day (BID) | ORAL | 0 refills | Status: DC | PRN

## 2022-03-20 MED ORDER — HYDROCOD POLI-CHLORPHE POLI ER 10-8 MG/5ML PO SUER: 5.0000 mL | Freq: Two times a day (BID) | ORAL | 0 refills | Status: DC | PRN

## 2022-03-20 MED ORDER — MONTELUKAST SODIUM 10 MG PO TABS: 10.0000 mg | ORAL_TABLET | Freq: Every day | ORAL | 3 refills | Status: DC

## 2022-03-20 MED ORDER — PREDNISONE 10 MG PO TABS: ORAL_TABLET | ORAL | 1 refills | Status: DC

## 2022-03-20 MED ORDER — TRAMADOL HCL 50 MG PO TABS: 50.0000 mg | ORAL_TABLET | Freq: Three times a day (TID) | ORAL | 1 refills | Status: DC | PRN

## 2022-03-20 NOTE — Assessment & Plan Note (Addendum)
Refractory.  There is an element of cyclical cough versus other.  I renewed Tussionex prn Try tramadol prn during daytime for cough suppression Deltasone taper if worse Tessalon 200 mg 3 times daily as needed Trelegy to continue daily High-resolution chest CT was ordered Start Singulair

## 2022-03-20 NOTE — Progress Notes (Signed)
Subjective:  Patient ID: Whitney [name], female    DOB: October 01, 1955  Age: 67 y.o. MRN: [id number]  CC: No chief complaint on file.   HPI Whitney [name] presents for persistent spastic cough spells that would cut her breath off x 3 weeks. Pt took Zpac, now on Augmentin per ENT. On  medrol pack.  Trelegy x 10 d - did not seem to help.  Tussionex leftovers helped with cough at night    Outpatient Medications Prior to Visit  Medication Sig Dispense Refill   amoxicillin-clavulanate (AUGMENTIN) 875-125 MG tablet Take 1 tablet by mouth 2 (two) times daily.     butalbital-acetaminophen-caffeine (FIORICET) 50-325-40 MG tablet butalbital-acetaminophen-caffeine 50 mg-325 mg-40 mg tablet  Take 1 tablet every 4 hours by oral route. 14 tablet 5   CALCIUM PO Take by mouth.     Cholecalciferol (VITAMIN D3) 50 MCG (2000 UT) capsule Take 1 capsule (2,000 Units total) by mouth daily. 100 capsule 3   LUTEIN PO Take 1 capsule by mouth daily.     pantoprazole (PROTONIX) 40 MG tablet Take 40 mg by mouth as needed.     zolmitriptan (ZOMIG-ZMT) 5 MG disintegrating tablet Take 5 mg by mouth as needed for migraine.      No facility-administered medications prior to visit.    ROS: Review of Systems  Objective:  BP (!) 140/80 (BP Location: Left Arm, Patient Position: Sitting, Cuff Size: Normal)   Pulse 85   Temp 98.5 F (36.9 C) (Oral)   Ht 5\' 6"  (1.676 m)   Wt 150 lb (68 kg)   SpO2 98%   BMI 24.21 kg/m   BP Readings from Last 3 Encounters:  03/20/22 (!) 140/80  11/30/21 135/71  05/15/21 138/72    Wt Readings from Last 3 Encounters:  03/20/22 150 lb (68 kg)  11/30/21 152 lb 6.4 oz (69.1 kg)  05/15/21 148 lb 3.2 oz (67.2 kg)    Physical Exam   Lab Results  Component Value Date   WBC 6.1 12/11/2008   HGB 13.7 12/11/2008   HCT 39.4 12/11/2008   PLT 155 12/11/2008   GLUCOSE 95 12/11/2008   ALT 33 12/11/2008   AST 35 12/11/2008   NA 139 12/11/2008   K 4.2 12/11/2008    CL 105 12/11/2008   CREATININE 0.8 12/11/2008   BUN 18 12/11/2008   CO2 29 12/11/2008    CT TIBIA FIBULA LEFT WO CONTRAST  Result Date: 02/09/2022 CLINICAL DATA:  Lower leg injury sustained in fall 1.5 weeks ago. No previous relevant surgery. EXAM: CT OF THE LOWER LEFT EXTREMITY WITHOUT CONTRAST TECHNIQUE: Multidetector CT imaging of the left lower leg was performed according to the standard protocol. RADIATION DOSE REDUCTION: This exam was performed according to the departmental dose-optimization program which includes automated exposure control, adjustment of the mA and/or kV according to patient size and/or use of iterative reconstruction technique. COMPARISON:  None Available. FINDINGS: Bones/Joint/Cartilage No evidence of acute fracture or dislocation. Mild tricompartmental degenerative changes at the left knee with a small knee joint effusion and meniscal chondrocalcinosis. Incompletely visualized Baker's cyst measuring up to 4.2 x 2.8 cm transverse and at least 5.6 cm in length. No significant findings at the ankle. Ligaments Suboptimally assessed by CT. Muscles and Tendons Unremarkable. Soft tissues Mild nonspecific subcutaneous edema anteriorly at the knee and distally within the lower leg. No focal fluid collection, foreign body or soft tissue emphysema identified. Scattered small soft tissue calcifications distally in the lower leg.  IMPRESSION: 1. No evidence of acute fracture or dislocation. 2. Nonspecific subcutaneous edema anteriorly at the knee and distally within the lower leg. No focal fluid collection, foreign body or soft tissue emphysema. 3. Mild tricompartmental degenerative changes at the left knee with a small knee joint effusion and meniscal chondrocalcinosis. Incompletely visualized Baker's cyst. Electronically Signed   By: Richardean Sale M.D.   On: 02/09/2022 15:48    Assessment & Plan:   Problem List Items Addressed This Visit       Respiratory   Cough variant asthma -  Primary    Refractory.  There is an element of cyclical cough versus other.  I renewed Tussionex prn Try tramadol prn during daytime for cough suppression Deltasone taper if worse Tessalon 200 mg 3 times daily as needed Trelegy to continue daily High-resolution chest CT was ordered Start Singulair       Relevant Medications   predniSONE (DELTASONE) 10 MG tablet   montelukast (SINGULAIR) 10 MG tablet   Other Relevant Orders   CT Chest High Resolution   Other Visit Diagnoses     Refractory chronic cough       Relevant Orders   CT Chest High Resolution   Shortness of breath       Relevant Orders   CT Chest High Resolution         Meds ordered this encounter  Medications   benzonatate (TESSALON) 200 MG capsule    Sig: Take 1 capsule (200 mg total) by mouth 2 (two) times daily as needed for cough.    Dispense:  20 capsule    Refill:  0   traMADol (ULTRAM) 50 MG tablet    Sig: Take 1 tablet (50 mg total) by mouth every 8 (eight) hours as needed for up to 5 days.    Dispense:  20 tablet    Refill:  1   chlorpheniramine-HYDROcodone (TUSSIONEX) 10-8 MG/5ML    Sig: Take 5 mLs by mouth every 12 (twelve) hours as needed for cough.    Dispense:  115 mL    Refill:  0   predniSONE (DELTASONE) 10 MG tablet    Sig: Prednisone 10 mg: take 4 tabs a day x 3 days; then 3 tabs a day x 4 days; then 2 tabs a day x 4 days, then 1 tab a day x 6 days, then stop. Take pc.    Dispense:  38 tablet    Refill:  1   montelukast (SINGULAIR) 10 MG tablet    Sig: Take 1 tablet (10 mg total) by mouth daily.    Dispense:  90 tablet    Refill:  3      Follow-up: Return in about 4 weeks (around 04/17/2022) for a follow-up visit.  Walker Kehr, MD

## 2022-04-02 ENCOUNTER — Ambulatory Visit (INDEPENDENT_AMBULATORY_CARE_PROVIDER_SITE_OTHER): Payer: Medicare Other | Admitting: Neurology

## 2022-04-02 DIAGNOSIS — G43709 Chronic migraine without aura, not intractable, without status migrainosus: Secondary | ICD-10-CM | POA: Diagnosis not present

## 2022-04-02 MED ORDER — NURTEC 75 MG PO TBDP: 75.0000 mg | ORAL_TABLET | Freq: Every day | ORAL | 0 refills | Status: DC | PRN

## 2022-04-02 MED ORDER — ZOLMITRIPTAN 5 MG PO TBDP: 5.0000 mg | ORAL_TABLET | ORAL | 4 refills | Status: DC | PRN

## 2022-04-02 MED ORDER — ONABOTULINUMTOXINA 200 UNITS IJ SOLR
155.0000 [IU] | Freq: Once | INTRAMUSCULAR | Status: AC
  Administered 2022-04-02: 155 [IU] via INTRAMUSCULAR

## 2022-04-02 MED ORDER — UBRELVY 100 MG PO TABS: 100.0000 mg | ORAL_TABLET | ORAL | 0 refills | Status: DC | PRN

## 2022-04-02 NOTE — Progress Notes (Signed)
Consent Form Botulism Toxin Injection For Chronic Migraine  04/02/2022: Transfer of care from Dr. Ninfa Meeker.   Doing great on botox. Only 4 migraines a month >>>80% decrease in freg and severity of migraines and headaches since being on botox. Gave her samples of ubrelvy and nurtec to try as well, explained nurtec can be used acutely and preventatively or in the last 1-2 weeks when botox is wearing off to avoid breakthrough. Zomig works well acutely  Meds ordered this encounter  Medications   Rimegepant Sulfate (NURTEC) 75 MG TBDP    Sig: Take 1 tablet (75 mg total) by mouth daily as needed. For migraines. Take as close to onset of migraine as possible. One daily maximum.    Dispense:  6 tablet    Refill:  0   Ubrogepant (UBRELVY) 100 MG TABS    Sig: Take 1 tablet (100 mg total) by mouth every 2 (two) hours as needed. Maximum 200mg  a day.    Dispense:  6 tablet    Refill:  0   zolmitriptan (ZOMIG-ZMT) 5 MG disintegrating tablet    Sig: Take 1 tablet (5 mg total) by mouth as needed for migraine.    Dispense:  30 tablet    Refill:  4    3 month supply   botulinum toxin Type A (BOTOX) injection 155 Units    Botox- 200 units x 1 vial Lot: [id number] Expiration: 08/2024 NDC: 4315-4008-67  Bacteriostatic 0.9% Sodium Chloride- 71mL total Lot: [id number] Expiration: 11/25 NDC: 26712-458-09  Dx: X83.382 B/B       Reviewed orally with patient, additionally signature is on file:  Botulism toxin has been approved by the Federal drug administration for treatment of chronic migraine. Botulism toxin does not cure chronic migraine and it may not be effective in some patients.  The administration of botulism toxin is accomplished by injecting a small amount of toxin into the muscles of the neck and head. Dosage must be titrated for each individual. Any benefits resulting from botulism toxin tend to wear off after 3 months with a repeat injection required if benefit is to be maintained. Injections  are usually done every 3-4 months with maximum effect peak achieved by about 2 or 3 weeks. Botulism toxin is expensive and you should be sure of what costs you will incur resulting from the injection.  The side effects of botulism toxin use for chronic migraine may include:   -Transient, and usually mild, facial weakness with facial injections  -Transient, and usually mild, head or neck weakness with head/neck injections  -Reduction or loss of forehead facial animation due to forehead muscle weakness  -Eyelid drooping  -Dry eye  -Pain at the site of injection or bruising at the site of injection  -Double vision  -Potential unknown long term risks  Contraindications: You should not have Botox if you are pregnant, nursing, allergic to albumin, have an infection, skin condition, or muscle weakness at the site of the injection, or have myasthenia gravis, Lambert-Eaton syndrome, or ALS.  It is also possible that as with any injection, there may be an allergic reaction or no effect from the medication. Reduced effectiveness after repeated injections is sometimes seen and rarely infection at the injection site may occur. All care will be taken to prevent these side effects. If therapy is given over a long time, atrophy and wasting in the muscle injected may occur. Occasionally the patient's become refractory to treatment because they develop antibodies to the toxin. In this event, therapy  needs to be modified.  I have read the above information and consent to the administration of botulism toxin.    BOTOX PROCEDURE NOTE FOR MIGRAINE HEADACHE    Contraindications and precautions discussed with patient(above). Aseptic procedure was observed and patient tolerated procedure. Procedure performed by Dr. Georgia Dom  The condition has existed for more than 6 months, and pt does not have a diagnosis of ALS, Myasthenia Gravis or Lambert-Eaton Syndrome.  Risks and benefits of injections discussed and pt  agrees to proceed with the procedure.  Written consent obtained  These injections are medically necessary. Pt  receives good benefits from these injections. These injections do not cause sedations or hallucinations which the oral therapies may cause.  Description of procedure:  The patient was placed in a sitting position. The standard protocol was used for Botox as follows, with 5 units of Botox injected at each site:   -Procerus muscle, midline injection  -Corrugator muscle, bilateral injection  -Frontalis muscle, bilateral injection, with 2 sites each side, medial injection was performed in the upper one third of the frontalis muscle, in the region vertical from the medial inferior edge of the superior orbital rim. The lateral injection was again in the upper one third of the forehead vertically above the lateral limbus of the cornea, 1.5 cm lateral to the medial injection site.  -Temporalis muscle injection, 4 sites, bilaterally. The first injection was 3 cm above the tragus of the ear, second injection site was 1.5 cm to 3 cm up from the first injection site in line with the tragus of the ear. The third injection site was 1.5-3 cm forward between the first 2 injection sites. The fourth injection site was 1.5 cm posterior to the second injection site.   -Occipitalis muscle injection, 3 sites, bilaterally. The first injection was done one half way between the occipital protuberance and the tip of the mastoid process behind the ear. The second injection site was done lateral and superior to the first, 1 fingerbreadth from the first injection. The third injection site was 1 fingerbreadth superiorly and medially from the first injection site.  -Cervical paraspinal muscle injection, 2 sites, bilateral knee first injection site was 1 cm from the midline of the cervical spine, 3 cm inferior to the lower border of the occipital protuberance. The second injection site was 1.5 cm superiorly and  laterally to the first injection site.  -Trapezius muscle injection was performed at 3 sites, bilaterally. The first injection site was in the upper trapezius muscle halfway between the inflection point of the neck, and the acromion. The second injection site was one half way between the acromion and the first injection site. The third injection was done between the first injection site and the inflection point of the neck.   Will return for repeat injection in 3 months.   200 units of Botox was used, 45 U Botox not injected was wasted. The patient tolerated the procedure well, there were no complications of the above procedure.

## 2022-04-02 NOTE — Progress Notes (Signed)
Botox consent signed  Botox- 200 units x 1 vial Lot: [id number] Expiration: 08/2024 NDC: 0023-3921-02  Bacteriostatic 0.9% Sodium Chloride- 4mL total Lot: [id number] Expiration: 11/25 NDC: 63323-924-03  Dx: G43.709 B/B  

## 2022-04-06 ENCOUNTER — Other Ambulatory Visit: Payer: Self-pay | Admitting: Internal Medicine

## 2022-04-06 ENCOUNTER — Telehealth: Payer: Self-pay

## 2022-04-06 NOTE — Telephone Encounter (Signed)
Refill already sent to [facility] 

## 2022-04-06 NOTE — Telephone Encounter (Signed)
Pt is asking for a rx refill for   benzonatate (TESSALON) 200 MG capsule

## 2022-04-22 ENCOUNTER — Encounter (HOSPITAL_BASED_OUTPATIENT_CLINIC_OR_DEPARTMENT_OTHER): Payer: Self-pay

## 2022-04-22 ENCOUNTER — Emergency Department (HOSPITAL_BASED_OUTPATIENT_CLINIC_OR_DEPARTMENT_OTHER): Payer: Medicare Other

## 2022-04-22 ENCOUNTER — Emergency Department (HOSPITAL_BASED_OUTPATIENT_CLINIC_OR_DEPARTMENT_OTHER)
Admission: EM | Admit: 2022-04-22 | Discharge: 2022-04-23 | Disposition: A | Discharge status: Home / Self Care | Payer: Medicare Other | Attending: Emergency Medicine | Admitting: Emergency Medicine

## 2022-04-22 DIAGNOSIS — S0083XA Contusion of other part of head, initial encounter: Secondary | ICD-10-CM | POA: Insufficient documentation

## 2022-04-22 DIAGNOSIS — W01198A Fall on same level from slipping, tripping and stumbling with subsequent striking against other object, initial encounter: Secondary | ICD-10-CM | POA: Diagnosis not present

## 2022-04-22 DIAGNOSIS — Y990 Civilian activity done for income or pay: Secondary | ICD-10-CM | POA: Diagnosis not present

## 2022-04-22 DIAGNOSIS — S0990XA Unspecified injury of head, initial encounter: Secondary | ICD-10-CM

## 2022-04-22 NOTE — ED Triage Notes (Signed)
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 States she thinks she was out for approx 43mn. Endorses continued dizziness, 5/10 throbbing HA pain. Denies blurred vision, denies NV, no thinners

## 2022-04-23 NOTE — Discharge Instructions (Signed)
Take Tylenol 1000 mg or send with ibuprofen 600 mg every 4 hours as needed for pain.  Rest.  Return to the emergency department for worsening headache, weakness, numbness, visual disturbances, or for other new and concerning symptoms.

## 2022-04-23 NOTE — ED Provider Notes (Signed)
[facility] Provider Note   CSN: [id number] Arrival date & time: 04/22/22  2145     History  Chief Complaint  Patient presents with   Dizziness   Headache    Whitney [name] is a 67 y.o. female.  Patient is a 67 year old female with past medical history of anemia, GERD.  Patient presenting today for evaluation of head injury.  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  She describes a loss of consciousness of what she believes to be several minutes.  She presents here with headache and swelling to the left forehead.  She denies any visual disturbances, neck pain, chest pain, or other injury.  She does report dizziness.  The history is provided by the patient.       Home Medications Prior to Admission medications   Medication Sig Start Date End Date Taking? Authorizing Provider  benzonatate (TESSALON) 200 MG capsule TAKE 1 CAPSULE BY MOUTH 2 TIMES DAILY AS NEEDED FOR COUGH. 04/06/22   Plotnikov, Evie Lacks, MD  butalbital-acetaminophen-caffeine (FIORICET) 50-325-40 MG tablet butalbital-acetaminophen-caffeine 50 mg-325 mg-40 mg tablet  Take 1 tablet every 4 hours by oral route. 01/09/22   Melvenia Beam, MD  CALCIUM PO Take by mouth.    [provider]  Cholecalciferol (VITAMIN D3) 50 MCG (2000 UT) capsule Take 1 capsule (2,000 Units total) by mouth daily. 03/01/20   Plotnikov, Evie Lacks, MD  LUTEIN PO Take 1 capsule by mouth daily.    [provider]  pantoprazole (PROTONIX) 40 MG tablet Take 40 mg by mouth as needed.    [provider]  Rimegepant Sulfate (NURTEC) 75 MG TBDP Take 1 tablet (75 mg total) by mouth daily as needed. For migraines. Take as close to onset of migraine as possible. One daily maximum. 04/02/22   Melvenia Beam, MD  Ubrogepant (UBRELVY) 100 MG TABS Take 1 tablet (100 mg total) by mouth every 2 (two) hours as needed. Maximum 253m  a day. 04/02/22   AMelvenia Beam MD  zolmitriptan (ZOMIG-ZMT) 5 MG disintegrating tablet Take 1 tablet (5 mg total) by mouth as needed for migraine. 04/02/22   AMelvenia Beam MD      Allergies    Sulfonamide derivatives and Sulfa antibiotics    Review of Systems   Review of Systems  All other systems reviewed and are negative.   Physical Exam Updated Vital Signs BP (!) 158/90   Pulse 86   Temp 98.2 F (36.8 C) (Oral)   Resp 16   SpO2 96%  Physical Exam Vitals and nursing note reviewed.  Constitutional:      General: She is not in acute distress.    Appearance: She is well-developed. She is not diaphoretic.  HENT:     Head: Normocephalic.     Comments: There is a contusion and abrasion noted to the left forehead. Eyes:     Extraocular Movements: Extraocular movements intact.     Pupils: Pupils are equal, round, and reactive to light.  Cardiovascular:     Rate and Rhythm: Normal rate and regular rhythm.     Heart sounds: No murmur heard.    No friction rub. No gallop.  Pulmonary:     Effort: Pulmonary effort is normal. No respiratory distress.     Breath sounds: Normal breath sounds. No wheezing.  Abdominal:     General: Bowel sounds are normal. There is  no distension.     Palpations: Abdomen is soft.     Tenderness: There is no abdominal tenderness.  Musculoskeletal:        General: Normal range of motion.     Cervical back: Normal range of motion and neck supple.  Skin:    General: Skin is warm and dry.  Neurological:     General: No focal deficit present.     Mental Status: She is alert and oriented to person, place, and time.     Cranial Nerves: No cranial nerve deficit, dysarthria or facial asymmetry.     ED Results / Procedures / Treatments   Labs (all labs ordered are listed, but only abnormal results are displayed) Labs Reviewed - No data to display  EKG None  Radiology CT Head Wo Contrast  Result Date: 04/22/2022 CLINICAL DATA:  Headache,  post traumatic EXAM: CT HEAD WITHOUT CONTRAST TECHNIQUE: Contiguous axial images were obtained from the base of the skull through the vertex without intravenous contrast. RADIATION DOSE REDUCTION: This exam was performed according to the departmental dose-optimization program which includes automated exposure control, adjustment of the mA and/or kV according to patient size and/or use of iterative reconstruction technique. COMPARISON:  CT head 06/17/2012 FINDINGS: Brain: No evidence of large-territorial acute infarction. No parenchymal hemorrhage. No mass lesion. No extra-axial collection. No mass effect or midline shift. No hydrocephalus. Basilar cisterns are patent. Vascular: No hyperdense vessel. Skull: No acute fracture or focal lesion. Sinuses/Orbits: Paranasal sinuses and mastoid air cells are clear. The orbits are unremarkable. Other: None. IMPRESSION: No acute intracranial abnormality. Electronically Signed   By: Iven Finn M.D.   On: 04/22/2022 22:46    Procedures Procedures    Medications Ordered in ED Medications - No data to display  ED Course/ Medical Decision Making/ A&P  Patient presenting with complaints of a head injury as described in the HPI.  She arrives here with stable vital signs and is neurologically intact.  Physical examination shows a contusion/abrasion to the left upper forehead, but fails to reveal other abnormal findings.  CT of the head obtained showing no acute intracranial process.  Patient remains neurologically intact after 3 hours of observation.  She does not take any blood thinners.  I feel as though she can safely be discharged with Tylenol/ibuprofen and as needed return.  Final Clinical Impression(s) / ED Diagnoses Final diagnoses:  None    Rx / DC Orders ED Discharge Orders     None         Veryl Speak, MD 04/23/22 [phone]

## 2022-05-11 ENCOUNTER — Other Ambulatory Visit: Payer: Self-pay | Admitting: Internal Medicine

## 2022-05-15 ENCOUNTER — Other Ambulatory Visit: Payer: Self-pay | Admitting: Internal Medicine

## 2022-05-15 DIAGNOSIS — J45991 Cough variant asthma: Secondary | ICD-10-CM

## 2022-05-16 ENCOUNTER — Ambulatory Visit
Admission: RE | Admit: 2022-05-16 | Discharge: 2022-05-16 | Disposition: A | Discharge status: Home / Self Care | Payer: Medicare Other | Source: Ambulatory Visit | Attending: Internal Medicine | Admitting: Internal Medicine

## 2022-05-16 DIAGNOSIS — R0602 Shortness of breath: Secondary | ICD-10-CM

## 2022-05-16 DIAGNOSIS — J45991 Cough variant asthma: Secondary | ICD-10-CM

## 2022-05-16 DIAGNOSIS — R053 Chronic cough: Secondary | ICD-10-CM

## 2022-05-20 NOTE — Progress Notes (Signed)
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 Cough variant asthma  Last pulmonary eval 2014 Dr Joya Gaskins: Cough variant asthma Cough variant asthma now improved Plan Reduce Qvar in one week to two puff daily, then after two more weeks can stop Qvar Use benzonatate as needed Protonix daily Stay on flonase through end of spring Return as needed         History of Present Illness  05/21/2022  Pulmonary/ 1st office eval/Clancy Leiner / referred back to pulmonary for recurrent cough since end of 2023 rx Trelegy> d/c / singulair and short course pred plus narcotic cough meds/tramadol  Chief Complaint  Patient presents with   Consult    Cough x 3 months.  Slight SOB at times.  Teoh eval p 4 weeks and rx augmentin Dyspnea:  no doe  Cough: scant purulent sporadic ? A bit worse at hs p tramadol  Sleep: no problem while sleeping  SABA use: none    Past Medical History:  Diagnosis Date   Allergic rhinitis    Bronchitis    Cough variant asthma    Gastritis    GERD (gastroesophageal reflux disease)    Migraine    Personal history of colonic polyps     Outpatient Medications Prior to Visit  Medication Sig Dispense Refill   butalbital-acetaminophen-caffeine (FIORICET) 50-325-40 MG tablet butalbital-acetaminophen-caffeine 50 mg-325 mg-40 mg tablet  Take 1 tablet every 4 hours by oral route. 14 tablet 5   CALCIUM PO Take by mouth.     Cholecalciferol (VITAMIN D3) 50 MCG (2000 UT) capsule Take 1 capsule (2,000 Units total) by mouth daily. 100 capsule 3   LUTEIN PO Take 1 capsule by mouth daily.     pantoprazole (PROTONIX) 40 MG tablet Take 40 mg by mouth as needed.     traMADol (ULTRAM) 50 MG tablet Take 1 tablet (50 mg total) by mouth every 8 (eight) hours as needed. 90 tablet 1   zolmitriptan (ZOMIG-ZMT) 5 MG disintegrating tablet Take 1 tablet (5 mg  total) by mouth as needed for migraine. 30 tablet 4   benzonatate (TESSALON) 200 MG capsule TAKE 1 CAPSULE BY MOUTH 2 TIMES DAILY AS NEEDED FOR COUGH. (Patient not taking: Reported on 05/21/2022) 20 capsule 0   Rimegepant Sulfate (NURTEC) 75 MG TBDP Take 1 tablet (75 mg total) by mouth daily as needed. For migraines. Take as close to onset of migraine as possible. One daily maximum. (Patient not taking: Reported on 05/21/2022) 6 tablet 0   Ubrogepant (UBRELVY) 100 MG TABS Take 1 tablet (100 mg total) by mouth every 2 (two) hours as needed. Maximum 200mg  a day. (Patient not taking: Reported on 05/21/2022) 6 tablet 0   No facility-administered medications prior to visit.     Objective:     BP 130/64 (BP Location: Left Arm, Patient Position: Sitting, Cuff Size: Large)   Pulse 93   Temp 98.5 F (36.9 C) (Oral)   Ht 5\' 5"  (1.651 m)   Wt 155 lb 6.4 oz (70.5 kg)   SpO2 97%   BMI 25.86 kg/m   SpO2: 97 %   HEENT : Oropharynx  clear     Nasal turbinates moderate non-specific edeam   NECK :  without  apparent JVD/ palpable Nodes/TM    LUNGS: no acc muscle use,  Nl contour chest which is  clear to A and P bilaterally without cough on insp or exp maneuvers   CV:  RRR  no s3 or murmur or increase in P2, and no edema   ABD:  soft and nontender with nl inspiratory excursion in the supine position. No bruits or organomegaly appreciated   MS:  Nl gait/ ext warm without deformities Or obvious joint restrictions  calf tenderness, cyanosis or clubbing    SKIN: warm and dry without lesions    NEURO:  alert, approp, nl sensorium with  no motor or cerebellar deficits apparent.    I personally reviewed images and agree with radiology impression as follows:   Chest HRCT   05/16/22 1. No evidence of fibrotic interstitial lung disease. 2. New part solid nodule of the right middle lobe measuring 10 mm in overall mean diameter with 5 mm solid component. Follow-up non-contrast CT recommended at 3-6  months to confirm persistence. 3. Moderate air trapping, findings can be seen in the setting of small airways disease.      Assessment   No problem-specific Assessment & Plan notes found for this encounter.     Christinia Gully, MD 05/21/2022

## 2022-05-21 ENCOUNTER — Encounter: Payer: Self-pay | Admitting: Internal Medicine

## 2022-05-21 ENCOUNTER — Ambulatory Visit (INDEPENDENT_AMBULATORY_CARE_PROVIDER_SITE_OTHER): Payer: Medicare Other | Admitting: Internal Medicine

## 2022-05-21 VITALS — BP 130/64 | HR 93 | Temp 98.5°F | Ht 65.0 in | Wt 155.4 lb

## 2022-05-21 DIAGNOSIS — J45991 Cough variant asthma: Secondary | ICD-10-CM | POA: Diagnosis not present

## 2022-05-21 DIAGNOSIS — R918 Other nonspecific abnormal finding of lung field: Secondary | ICD-10-CM | POA: Diagnosis not present

## 2022-05-21 MED ORDER — PREDNISONE 10 MG PO TABS: ORAL_TABLET | ORAL | 0 refills | Status: DC

## 2022-05-21 NOTE — Patient Instructions (Addendum)
GERD (REFLUX)  is an extremely common cause of respiratory symptoms just like yours , many times with no obvious heartburn at all.    It can be treated with medication, but also with lifestyle changes including elevation of the head of your bed (ideally with 6 -8inch blocks under the headboard of your bed),  Smoking cessation, avoidance of late meals, excessive alcohol, and avoid fatty foods, chocolate, peppermint, colas, red wine, and acidic juices such as orange juice.  NO MINT OR MENTHOL PRODUCTS SO NO COUGH DROPS - Ludens  USE SUGARLESS CANDY INSTEAD (Jolley ranchers or Stover's or Astronomer) or even ice chips will also do - the key is to swallow to prevent all throat clearing. NO OIL BASED VITAMINS - use powdered substitutes.  Avoid fish oil when coughing.   Pantoprazole (protonix) 40 mg   Take  30-60 min before first meal of the day and Pepcid (famotidine)  20 mg after supper until return to office - this is the best way to tell whether stomach acid is contributing to your problem.    Prednisone 10 mg take  4 each am x 2 days,   2 each am x 2 days,  1 each am x 2 days and stop    Consider seeing Dr Benjamine Mola if sinus problem persists   Take delsym two tsp every 12 hours and supplement if needed with  tramadol 50 mg up to 2 every 4 hours to suppress the urge to cough. Swallowing water and/or using ice chips/non mint and menthol containing candies (such as lifesavers or sugarless jolly ranchers) are also effective.  You should rest your voice and avoid activities that you know make you cough.  Once you have eliminated the cough for 3 straight days try reducing the tramadol first,  then the delsym as tolerated.    We will be in touch for Chest Ct in Sept 2024

## 2022-05-22 DIAGNOSIS — R918 Other nonspecific abnormal finding of lung field: Secondary | ICD-10-CM | POA: Insufficient documentation

## 2022-05-22 NOTE — Assessment & Plan Note (Addendum)
See CT chest 05/17/22 > in reminder file for 11/17/22   CT results reviewed with pt >>> Too small for PET or bx, not suspicious enough for excisional bx > really only option for now is follow the Fleischner society guidelines as rec by radiology. = 6 months in this never smoker with very small  mixed density lesions likely inflammatory   Discussed in detail all the  indications, usual  risks and alternatives  relative to the benefits with patient who agrees to proceed with w/u as outlined.            Each maintenance medication was reviewed in detail including emphasizing most importantly the difference between maintenance and prns and under what circumstances the prns are to be triggered using an action plan format where appropriate.  Total time for H and P, chart review, counseling, and generating customized AVS unique to this office visit / same day charting  > 30 min with pt new to me

## 2022-05-22 NOTE — Assessment & Plan Note (Signed)
Of the three most common causes of  Sub-acute / recurrent or chronic cough, only one (GERD)  can actually contribute to/ trigger  the other two (asthma and post nasal drip syndrome)  and perpetuate the cylce of cough.  While not intuitively obvious, many patients with chronic low grade reflux do not cough until there is a primary insult that disturbs the protective epithelial barrier and exposes sensitive nerve endings.   This is typically viral but can due to PNDS and  either may apply here.   >>>  The point is that once this occurs, it is difficult to eliminate the cycle  using anything but a maximally effective acid suppression regimen at least in the short run, accompanied by an appropriate diet to address non acid GERD and control / eliminate the cough itself for at least 3 days with tramadol  >>> also so added 6 day taper off  Prednisone starting at 40 mg per day in case of component of Th-2 driven upper or lower airways inflammation (if cough responds short term only to relapse before return while will on full rx for uacs (as above), then  that would point to allergic rhinitis/ asthma or eos bronchitis as alternative dx)

## 2022-06-04 ENCOUNTER — Encounter: Payer: Self-pay | Admitting: *Deleted

## 2022-06-26 ENCOUNTER — Ambulatory Visit: Payer: Medicare Other | Admitting: Neurology

## 2022-06-26 ENCOUNTER — Ambulatory Visit (INDEPENDENT_AMBULATORY_CARE_PROVIDER_SITE_OTHER): Payer: Medicare Other | Admitting: Neurology

## 2022-06-26 DIAGNOSIS — G43709 Chronic migraine without aura, not intractable, without status migrainosus: Secondary | ICD-10-CM

## 2022-06-26 MED ORDER — ONABOTULINUMTOXINA 200 UNITS IJ SOLR
155.0000 [IU] | Freq: Once | INTRAMUSCULAR | Status: AC
  Administered 2022-06-26: 155 [IU] via INTRAMUSCULAR

## 2022-06-26 MED ORDER — METHOCARBAMOL 750 MG PO TABS: 750.0000 mg | ORAL_TABLET | Freq: Four times a day (QID) | ORAL | 6 refills | Status: DC

## 2022-06-26 NOTE — Progress Notes (Signed)
Botox- 200 units x 1 vial Lot: [id number] Expiration: 08/2024 NDC: 0023-3921-02  Bacteriostatic 0.9% Sodium Chloride- 4 mL total Lot: [id number] Expiration: 01/2024 NDC: 63323-924-03  Dx: G43 .709  B/B Witnessed by Sandy,RN 

## 2022-06-26 NOTE — Progress Notes (Signed)
Consent Form Botulism Toxin Injection For Chronic Migraine  06/26/2022: Stable, still doing great.  04/02/2022: Transfer of care from Dr. Antonietta Jewel.   Doing great on botox. Only 4 migraines a month >>>80% decrease in freg and severity of migraines and headaches since being on botox. Gave her samples of ubrelvy and nurtec to try as well, explained nurtec can be used acutely and preventatively or in the last 1-2 weeks when botox is wearing off to avoid breakthrough. Zomig works well acutely  Meds ordered this encounter  Medications   botulinum toxin Type A (BOTOX) injection 155 Units    Botox- 200 units x 1 vial Lot: [id number] Expiration: 08/2024 NDC: 4540-9811-91  Bacteriostatic 0.9% Sodium Chloride- 4 mL total Lot: [id number] Expiration: 01/2024 NDC: 21308-657-84  Dx: O96.295 B/B Witnessed by Delmer Islam   methocarbamol (ROBAXIN) 750 MG tablet    Sig: Take 1 tablet (750 mg total) by mouth 4 (four) times daily.    Dispense:  60 tablet    Refill:  6       Reviewed orally with patient, additionally signature is on file:  Botulism toxin has been approved by the Federal drug administration for treatment of chronic migraine. Botulism toxin does not cure chronic migraine and it may not be effective in some patients.  The administration of botulism toxin is accomplished by injecting a small amount of toxin into the muscles of the neck and head. Dosage must be titrated for each individual. Any benefits resulting from botulism toxin tend to wear off after 3 months with a repeat injection required if benefit is to be maintained. Injections are usually done every 3-4 months with maximum effect peak achieved by about 2 or 3 weeks. Botulism toxin is expensive and you should be sure of what costs you will incur resulting from the injection.  The side effects of botulism toxin use for chronic migraine may include:   -Transient, and usually mild, facial weakness with facial injections  -Transient, and  usually mild, head or neck weakness with head/neck injections  -Reduction or loss of forehead facial animation due to forehead muscle weakness  -Eyelid drooping  -Dry eye  -Pain at the site of injection or bruising at the site of injection  -Double vision  -Potential unknown long term risks  Contraindications: You should not have Botox if you are pregnant, nursing, allergic to albumin, have an infection, skin condition, or muscle weakness at the site of the injection, or have myasthenia gravis, Lambert-Eaton syndrome, or ALS.  It is also possible that as with any injection, there may be an allergic reaction or no effect from the medication. Reduced effectiveness after repeated injections is sometimes seen and rarely infection at the injection site may occur. All care will be taken to prevent these side effects. If therapy is given over a long time, atrophy and wasting in the muscle injected may occur. Occasionally the patient's become refractory to treatment because they develop antibodies to the toxin. In this event, therapy needs to be modified.  I have read the above information and consent to the administration of botulism toxin.    BOTOX PROCEDURE NOTE FOR MIGRAINE HEADACHE    Contraindications and precautions discussed with patient(above). Aseptic procedure was observed and patient tolerated procedure. Procedure performed by Dr. Artemio Aly  The condition has existed for more than 6 months, and pt does not have a diagnosis of ALS, Myasthenia Gravis or Lambert-Eaton Syndrome.  Risks and benefits of injections discussed and pt agrees to proceed with the  procedure.  Written consent obtained  These injections are medically necessary. Pt  receives good benefits from these injections. These injections do not cause sedations or hallucinations which the oral therapies may cause.  Description of procedure:  The patient was placed in a sitting position. The standard protocol was used for Botox  as follows, with 5 units of Botox injected at each site:   -Procerus muscle, midline injection  -Corrugator muscle, bilateral injection  -Frontalis muscle, bilateral injection, with 2 sites each side, medial injection was performed in the upper one third of the frontalis muscle, in the region vertical from the medial inferior edge of the superior orbital rim. The lateral injection was again in the upper one third of the forehead vertically above the lateral limbus of the cornea, 1.5 cm lateral to the medial injection site.  -Temporalis muscle injection, 4 sites, bilaterally. The first injection was 3 cm above the tragus of the ear, second injection site was 1.5 cm to 3 cm up from the first injection site in line with the tragus of the ear. The third injection site was 1.5-3 cm forward between the first 2 injection sites. The fourth injection site was 1.5 cm posterior to the second injection site.   -Occipitalis muscle injection, 3 sites, bilaterally. The first injection was done one half way between the occipital protuberance and the tip of the mastoid process behind the ear. The second injection site was done lateral and superior to the first, 1 fingerbreadth from the first injection. The third injection site was 1 fingerbreadth superiorly and medially from the first injection site.  -Cervical paraspinal muscle injection, 2 sites, bilateral knee first injection site was 1 cm from the midline of the cervical spine, 3 cm inferior to the lower border of the occipital protuberance. The second injection site was 1.5 cm superiorly and laterally to the first injection site.  -Trapezius muscle injection was performed at 3 sites, bilaterally. The first injection site was in the upper trapezius muscle halfway between the inflection point of the neck, and the acromion. The second injection site was one half way between the acromion and the first injection site. The third injection was done between the first  injection site and the inflection point of the neck.   Will return for repeat injection in 3 months.   200 units of Botox was used, 45 U Botox not injected was wasted. The patient tolerated the procedure well, there were no complications of the above procedure.

## 2022-06-27 NOTE — Telephone Encounter (Signed)
Form completed signed and sent to medical records. Pt had discussed during botox visit yesterday.

## 2022-07-03 ENCOUNTER — Institutional Professional Consult (permissible substitution): Payer: Medicare Other | Admitting: Internal Medicine

## 2022-08-02 NOTE — Progress Notes (Unsigned)
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 Cough variant asthma   Final  pulmonary eval 2014 Dr Delford Field: Cough variant asthma Cough variant asthma now improved Plan Reduce Qvar in one week to two puff daily, then after two more weeks can stop Qvar Use benzonatate as needed Protonix daily Stay on flonase through end of spring Return as needed         History of Present Illness  05/21/2022  Pulmonary/ 1st office eval/Whitney [name] / referred back to pulmonary for recurrent cough since end of 2023 rx Trelegy> d/c /still on singulair and a/p short course pred plus narcotic cough meds/tramadol > still coughing  Chief Complaint  Patient presents with   Consult    Cough x 3 months.  Slight SOB at times.  Teoh eval p 4 weeks and rx augmentin Dyspnea:  no doe  Cough: scant purulent sporadic ? A bit worse at hs p tramadol  Sleep: no problem while sleeping  SABA use: none  Rec GERD diet  Pantoprazole (protonix) 40 mg   Take  30-60 min before first meal of the day and Pepcid (famotidine)  20 mg after supper until return to office - this is the best way to tell whether stomach acid is contributing to your problem.  Prednisone 10 mg take  4 each am x 2 days,   2 each am x 2 days,  1 each am x 2 days and stop   Consider seeing Dr [name] if sinus problem persists  Take delsym two tsp every 12 hours and supplement if needed with  tramadol 50 mg up to 2 every 4 hours  We will be in touch for Chest Ct in Sept 2024   Teoph eval rec :   nasal spray and gabapentin 300 mg but did not start    08/03/2022  f/u ov/Whitney [name] re: cough   maint on PPI qam ac and pepcid 20 mg after supper  - has gabapentin 300 mg  Chief Complaint  Patient presents with   Follow-up    Occasional cough.  Discuss medication regimen.   Dyspnea:  none Cough: still occ on deep breath  and  at hs / dry  Sleeping: ok once asleep    No obvious day to day or daytime variability or assoc excess/ purulent sputum or mucus plugs or hemoptysis or cp or chest tightness, subjective wheeze or overt sinus or hb symptoms.   Sleeping  without nocturnal  or early am exacerbation  of respiratory  c/o's or need for noct saba. Also denies any obvious fluctuation of symptoms with weather or environmental changes or other aggravating or alleviating factors except as outlined above   No unusual exposure hx or h/o childhood pna/ asthma or knowledge of premature birth.  Current Allergies, Complete Past Medical History, Past Surgical History, Family History, and Social History were reviewed in [employer] record.  ROS  The following are not active complaints unless bolded Hoarseness, sore throat, dysphagia, dental problems, itching, sneezing,  nasal congestion or discharge of excess mucus or purulent secretions, ear ache,   fever, chills, sweats, unintended wt loss or wt gain, classically pleuritic or exertional cp,  orthopnea pnd or arm/hand swelling  or leg swelling, presyncope, palpitations, abdominal pain, anorexia, nausea, vomiting, diarrhea  or change in bowel habits or change  in bladder habits, change in stools or change in urine, dysuria, hematuria,  rash, arthralgias, visual complaints, headache, numbness, weakness or ataxia or problems with walking or coordination,  change in mood or  memory.        Current Meds  Medication Sig   butalbital-acetaminophen-caffeine (FIORICET) 50-325-40 MG tablet butalbital-acetaminophen-caffeine 50 mg-325 mg-40 mg tablet  Take 1 tablet every 4 hours by oral route.   CALCIUM PO Take by mouth.   Cholecalciferol (VITAMIN D3) 50 MCG (2000 UT) capsule Take 1 capsule (2,000 Units total) by mouth daily.   famotidine (PEPCID) 20 MG tablet Take 20 mg by mouth at bedtime.   ipratropium (ATROVENT) 0.06 % nasal spray Place 2 sprays into both nostrils  4 (four) times daily as needed for rhinitis.   LUTEIN PO Take 1 capsule by mouth daily.   methocarbamol (ROBAXIN) 750 MG tablet Take 1 tablet (750 mg total) by mouth 4 (four) times daily.   pantoprazole (PROTONIX) 40 MG tablet Take 40 mg by mouth as needed.   traMADol (ULTRAM) 50 MG tablet Take 1 tablet (50 mg total) by mouth every 8 (eight) hours as needed.   zolmitriptan (ZOMIG-ZMT) 5 MG disintegrating tablet Take 1 tablet (5 mg total) by mouth as needed for migraine.           Objective:     Wt Readings from Last 3 Encounters:  08/03/22 152 lb 12.8 oz (69.3 kg)  05/21/22 155 lb 6.4 oz (70.5 kg)  03/20/22 150 lb (68 kg)      Vital signs reviewed  08/03/2022  - Note at rest 02 sats  96% on RA   General appearance:    amb wf nad    HEENT : Oropharynx  clear          NECK :  without  apparent JVD/ palpable Nodes/TM    LUNGS: no acc muscle use,  Nl contour chest which is clear to A and P bilaterally without cough on insp or exp maneuvers   CV:  RRR  no s3 or murmur or increase in P2, and no edema    MS:  Nl gait/ ext warm without deformities Or obvious joint restrictions  calf tenderness, cyanosis or clubbing    SKIN: warm and dry without lesions    NEURO:  alert, approp, nl sensorium with  no motor or cerebellar deficits apparent.         Assessment

## 2022-08-03 ENCOUNTER — Encounter: Payer: Self-pay | Admitting: Internal Medicine

## 2022-08-03 ENCOUNTER — Ambulatory Visit (INDEPENDENT_AMBULATORY_CARE_PROVIDER_SITE_OTHER): Payer: Medicare Other | Admitting: Internal Medicine

## 2022-08-03 ENCOUNTER — Ambulatory Visit: Payer: Medicare Other | Admitting: Internal Medicine

## 2022-08-03 VITALS — BP 122/70 | HR 95 | Temp 98.3°F | Ht 66.0 in | Wt 152.8 lb

## 2022-08-03 DIAGNOSIS — J45991 Cough variant asthma: Secondary | ICD-10-CM

## 2022-08-03 NOTE — Patient Instructions (Signed)
For drainage / throat tickle try take CHLORPHENIRAMINE  4 mg  ("Allergy Relief" 4mg   at [facility] should be easiest to find in the blue box usually on bottom shelf)  take one every 4 hours as needed - extremely effective and inexpensive over the counter- may cause drowsiness so start with just a dose or two an hour before bedtime and see how you tolerate it before trying in daytime.   Ok with to use protonix  40 and pepcid 20 mg as needed for slightest flare of cough for any reason.

## 2022-08-04 NOTE — Assessment & Plan Note (Signed)
[facility]  05/17/22  small HH/ no ILD - Spring 2024 Te[facility] eval rec :   nasal spray and gabapentin 300 mg but did not start  - 95% better at f/u ov 08/03/2022 with cough at end insp not typical of asthma but rather Upper airway cough syndrome (previously labeled PNDS),  is so named because it's frequently impossible to sort out how much is  CR/sinusitis with freq throat clearing (which can be related to primary GERD)   vs  causing  secondary (" extra esophageal")  GERD from wide swings in gastric pressure that occur with throat clearing, often  promoting self use of mint and menthol lozenges that reduce the lower esophageal sphincter tone and exacerbate the problem further in a cyclical fashion.   These are the same pts (now being labeled as having "irritable larynx syndrome" by some cough centers) who not infrequently have a history of having failed to tolerate ace inhibitors,  dry powder inhalers or biphosphonates or report having atypical/extraesophageal reflux symptoms that don't respond to standard doses of PPI  and are easily confused as having aecopd or asthma flares by even experienced allergists/ pulmonologists (myself included).   Rec add 1st gen H1 blockers per guidelines  and if not to or still coughing at hs then reasonable to try gabapentin as per Dr Avel Sensor recs with continued rx for GERD until cough is gone completely   Pulmonary f/u can be prn          Each maintenance medication was reviewed in detail including emphasizing most importantly the difference between maintenance and prns and under what circumstances the prns are to be triggered using an action plan format where appropriate.  Total time for H and P, chart review, counseling,  and generating customized AVS unique to this office visit / same day charting = 24 min summary f/u ov

## 2022-09-03 ENCOUNTER — Telehealth: Payer: Self-pay | Admitting: Neurology

## 2022-09-03 NOTE — Telephone Encounter (Signed)
Called to reschedule pt, pt is not due for Botox injection until 7/16, pt stated she is out of town that week. Dr. Lucia Gaskins would you be okay with adding her on at the end of your day the week of 7/22?

## 2022-09-03 NOTE — Telephone Encounter (Signed)
Pt is asking about a call to go about setting up her Botox since her insurance has been corrected

## 2022-09-04 NOTE — Telephone Encounter (Signed)
Pt scheduled for botox injection for 09/24/22 at 4pm

## 2022-09-04 NOTE — Telephone Encounter (Signed)
Completely fine thanks!

## 2022-09-24 ENCOUNTER — Ambulatory Visit (INDEPENDENT_AMBULATORY_CARE_PROVIDER_SITE_OTHER): Payer: Medicare Other | Admitting: Neurology

## 2022-09-24 DIAGNOSIS — G43709 Chronic migraine without aura, not intractable, without status migrainosus: Secondary | ICD-10-CM | POA: Diagnosis not present

## 2022-09-24 MED ORDER — BUTALBITAL-APAP-CAFFEINE 50-325-40 MG PO TABS: ORAL_TABLET | ORAL | 5 refills | Status: AC

## 2022-09-24 MED ORDER — ONABOTULINUMTOXINA 200 UNITS IJ SOLR
155.0000 [IU] | Freq: Once | INTRAMUSCULAR | Status: AC
Start: 2022-09-24 — End: 2022-09-24
  Administered 2022-09-24: 155 [IU] via INTRAMUSCULAR

## 2022-09-24 MED ORDER — ZOLMITRIPTAN 5 MG PO TBDP
5.0000 mg | ORAL_TABLET | ORAL | 4 refills | Status: AC | PRN
Start: 2022-09-24 — End: ?

## 2022-09-24 MED ORDER — METHOCARBAMOL 750 MG PO TABS: 750.0000 mg | ORAL_TABLET | Freq: Four times a day (QID) | ORAL | 6 refills | Status: DC

## 2022-09-24 NOTE — Progress Notes (Signed)
Botox- 200 units x 1 vial Lot: [id number]  Expiration: 10/2024 NDC: 4540-9811-91  Bacteriostatic 0.9% Sodium Chloride- 4 mL  Lot: [id number] Expiration: 01/04/2024 NDC: 6213-0865-78  Dx: I69.629 B/B Witnessed by Elana Alm

## 2022-09-24 NOTE — Progress Notes (Signed)
Consent Form Botulism Toxin Injection For Chronic Migraine  09/24/2022: doing great 06/26/2022: Stable, still doing great.  04/02/2022: Transfer of care from Dr. Antonietta Jewel.   Doing great on botox. Only 4 migraines a month >>>80% decrease in freg and severity of migraines and headaches since being on botox. Gave her samples of ubrelvy and nurtec to try as well, explained nurtec can be used acutely and preventatively or in the last 1-2 weeks when botox is wearing off to avoid breakthrough. Zomig works well acutely  Meds ordered this encounter  Medications   botulinum toxin Type A (BOTOX) injection 155 Units    Botox- 200 units x 1 vial Lot: [id number]  Expiration: 10/2024 NDC: 1660-6301-60  Bacteriostatic 0.9% Sodium Chloride- 4 mL  Lot: [id number] Expiration: 01/04/2024 NDC: 5573-2202-54  Dx: Y70.623 B/B Witnessed by Elana Alm       Reviewed orally with patient, additionally signature is on file:  Botulism toxin has been approved by the Federal drug administration for treatment of chronic migraine. Botulism toxin does not cure chronic migraine and it may not be effective in some patients.  The administration of botulism toxin is accomplished by injecting a small amount of toxin into the muscles of the neck and head. Dosage must be titrated for each individual. Any benefits resulting from botulism toxin tend to wear off after 3 months with a repeat injection required if benefit is to be maintained. Injections are usually done every 3-4 months with maximum effect peak achieved by about 2 or 3 weeks. Botulism toxin is expensive and you should be sure of what costs you will incur resulting from the injection.  The side effects of botulism toxin use for chronic migraine may include:   -Transient, and usually mild, facial weakness with facial injections  -Transient, and usually mild, head or neck weakness with head/neck injections  -Reduction or loss of forehead facial animation due to forehead  muscle weakness  -Eyelid drooping  -Dry eye  -Pain at the site of injection or bruising at the site of injection  -Double vision  -Potential unknown long term risks  Contraindications: You should not have Botox if you are pregnant, nursing, allergic to albumin, have an infection, skin condition, or muscle weakness at the site of the injection, or have myasthenia gravis, Lambert-Eaton syndrome, or ALS.  It is also possible that as with any injection, there may be an allergic reaction or no effect from the medication. Reduced effectiveness after repeated injections is sometimes seen and rarely infection at the injection site may occur. All care will be taken to prevent these side effects. If therapy is given over a long time, atrophy and wasting in the muscle injected may occur. Occasionally the patient's become refractory to treatment because they develop antibodies to the toxin. In this event, therapy needs to be modified.  I have read the above information and consent to the administration of botulism toxin.    BOTOX PROCEDURE NOTE FOR MIGRAINE HEADACHE    Contraindications and precautions discussed with patient(above). Aseptic procedure was observed and patient tolerated procedure. Procedure performed by Dr. Artemio Aly  The condition has existed for more than 6 months, and pt does not have a diagnosis of ALS, Myasthenia Gravis or Lambert-Eaton Syndrome.  Risks and benefits of injections discussed and pt agrees to proceed with the procedure.  Written consent obtained  These injections are medically necessary. Pt  receives good benefits from these injections. These injections do not cause sedations or hallucinations which the oral therapies may  cause.  Description of procedure:  The patient was placed in a sitting position. The standard protocol was used for Botox as follows, with 5 units of Botox injected at each site:   -Procerus muscle, midline injection  -Corrugator muscle,  bilateral injection  -Frontalis muscle, bilateral injection, with 2 sites each side, medial injection was performed in the upper one third of the frontalis muscle, in the region vertical from the medial inferior edge of the superior orbital rim. The lateral injection was again in the upper one third of the forehead vertically above the lateral limbus of the cornea, 1.5 cm lateral to the medial injection site.  -Temporalis muscle injection, 4 sites, bilaterally. The first injection was 3 cm above the tragus of the ear, second injection site was 1.5 cm to 3 cm up from the first injection site in line with the tragus of the ear. The third injection site was 1.5-3 cm forward between the first 2 injection sites. The fourth injection site was 1.5 cm posterior to the second injection site.   -Occipitalis muscle injection, 3 sites, bilaterally. The first injection was done one half way between the occipital protuberance and the tip of the mastoid process behind the ear. The second injection site was done lateral and superior to the first, 1 fingerbreadth from the first injection. The third injection site was 1 fingerbreadth superiorly and medially from the first injection site.  -Cervical paraspinal muscle injection, 2 sites, bilateral knee first injection site was 1 cm from the midline of the cervical spine, 3 cm inferior to the lower border of the occipital protuberance. The second injection site was 1.5 cm superiorly and laterally to the first injection site.  -Trapezius muscle injection was performed at 3 sites, bilaterally. The first injection site was in the upper trapezius muscle halfway between the inflection point of the neck, and the acromion. The second injection site was one half way between the acromion and the first injection site. The third injection was done between the first injection site and the inflection point of the neck.   Will return for repeat injection in 3 months.   200 units of  Botox was used, 45 U Botox not injected was wasted. The patient tolerated the procedure well, there were no complications of the above procedure.

## 2022-11-06 ENCOUNTER — Ambulatory Visit: Payer: Medicare Other | Admitting: Internal Medicine

## 2022-11-12 ENCOUNTER — Other Ambulatory Visit: Payer: Self-pay | Admitting: Internal Medicine

## 2022-11-12 DIAGNOSIS — R918 Other nonspecific abnormal finding of lung field: Secondary | ICD-10-CM

## 2022-11-21 ENCOUNTER — Encounter: Payer: Self-pay | Admitting: Internal Medicine

## 2022-11-21 ENCOUNTER — Ambulatory Visit (INDEPENDENT_AMBULATORY_CARE_PROVIDER_SITE_OTHER): Payer: Medicare Other | Admitting: Internal Medicine

## 2022-11-21 VITALS — BP 122/70 | HR 92 | Temp 98.6°F | Ht 66.0 in | Wt 153.0 lb

## 2022-11-21 DIAGNOSIS — E559 Vitamin D deficiency, unspecified: Secondary | ICD-10-CM

## 2022-11-21 DIAGNOSIS — M791 Myalgia, unspecified site: Secondary | ICD-10-CM | POA: Diagnosis not present

## 2022-11-21 DIAGNOSIS — G43709 Chronic migraine without aura, not intractable, without status migrainosus: Secondary | ICD-10-CM | POA: Diagnosis not present

## 2022-11-21 DIAGNOSIS — M2550 Pain in unspecified joint: Secondary | ICD-10-CM | POA: Diagnosis not present

## 2022-11-21 MED ORDER — MELOXICAM 7.5 MG PO TABS: 7.5000 mg | ORAL_TABLET | Freq: Every day | ORAL | 2 refills | Status: DC

## 2022-11-21 MED ORDER — ZOLPIDEM TARTRATE 5 MG PO TABS: 5.0000 mg | ORAL_TABLET | Freq: Every evening | ORAL | 1 refills | Status: DC | PRN

## 2022-11-21 NOTE — Progress Notes (Signed)
Subjective:  Patient ID: Whitney [name], female    DOB: Dec 24, 1955  Age: 67 y.o. MRN: [id number]  CC: Leg Pain   HPI Whitney [name] presents for fatigue, arthralgias. She is complaining of her knees being painful and locked x 5-10 min every morning.  Has been going on for several weeks.  It may have started with her prolonged cough episode following a viral infection. C/o fatigue, insomnia, arthralgia x weeks She started a steroid pack today by Ortho.  Knee x-rays were normal. S/p CTS surgery  Outpatient Medications Prior to Visit  Medication Sig Dispense Refill   butalbital-acetaminophen-caffeine (FIORICET) 50-325-40 MG tablet butalbital-acetaminophen-caffeine 50 mg-325 mg-40 mg tablet  Take 1 tablet every 4 hours by oral route. 14 tablet 5   CALCIUM PO Take by mouth.     Cholecalciferol (VITAMIN D3) 50 MCG (2000 UT) capsule Take 1 capsule (2,000 Units total) by mouth daily. 100 capsule 3   famotidine (PEPCID) 20 MG tablet Take 20 mg by mouth at bedtime.     ipratropium (ATROVENT) 0.06 % nasal spray Place 2 sprays into both nostrils 4 (four) times daily as needed for rhinitis.     LUTEIN PO Take 1 capsule by mouth daily.     methocarbamol (ROBAXIN) 750 MG tablet Take 1 tablet (750 mg total) by mouth 4 (four) times daily. 60 tablet 6   pantoprazole (PROTONIX) 40 MG tablet Take 40 mg by mouth as needed.     traMADol (ULTRAM) 50 MG tablet Take 1 tablet (50 mg total) by mouth every 8 (eight) hours as needed. 90 tablet 1   zolmitriptan (ZOMIG-ZMT) 5 MG disintegrating tablet Take 1 tablet (5 mg total) by mouth as needed for migraine. 30 tablet 4   No facility-administered medications prior to visit.    ROS: Review of Systems  Constitutional:  Negative for activity change, appetite change, chills, fatigue and unexpected weight change.  HENT:  Negative for congestion, mouth sores and sinus pressure.   Eyes:  Negative for visual disturbance.  Respiratory:  Negative for  cough and chest tightness.   Gastrointestinal:  Negative for abdominal pain and nausea.  Genitourinary:  Negative for difficulty urinating, frequency and vaginal pain.  Musculoskeletal:  Positive for arthralgias. Negative for back pain and gait problem.  Skin:  Negative for pallor and rash.  Neurological:  Negative for dizziness, tremors, weakness, numbness and headaches.  Psychiatric/Behavioral:  Negative for confusion and sleep disturbance.     Objective:  BP 122/70 (BP Location: Right Arm, Patient Position: Sitting, Cuff Size: Large)   Pulse 92   Temp 98.6 F (37 C) (Oral)   Ht 5\' 6"  (1.676 m)   Wt 153 lb (69.4 kg)   SpO2 96%   BMI 24.69 kg/m   BP Readings from Last 3 Encounters:  11/21/22 122/70  08/03/22 122/70  05/21/22 130/64    Wt Readings from Last 3 Encounters:  11/21/22 153 lb (69.4 kg)  08/03/22 152 lb 12.8 oz (69.3 kg)  05/21/22 155 lb 6.4 oz (70.5 kg)    Physical Exam Constitutional:      General: She is not in acute distress.    Appearance: Normal appearance. She is well-developed.  HENT:     Head: Normocephalic.     Right Ear: External ear normal.     Left Ear: External ear normal.     Nose: Nose normal.  Eyes:     General:        Right eye: No discharge.  Left eye: No discharge.     Conjunctiva/sclera: Conjunctivae normal.     Pupils: Pupils are equal, round, and reactive to light.  Neck:     Thyroid: No thyromegaly.     Vascular: No JVD.     Trachea: No tracheal deviation.  Cardiovascular:     Rate and Rhythm: Normal rate and regular rhythm.     Heart sounds: Normal heart sounds.  Pulmonary:     Effort: No respiratory distress.     Breath sounds: No stridor. No wheezing.  Abdominal:     General: Bowel sounds are normal. There is no distension.     Palpations: Abdomen is soft. There is no mass.     Tenderness: There is no abdominal tenderness. There is no guarding or rebound.  Musculoskeletal:        General: No tenderness.      Cervical back: Normal range of motion and neck supple. No rigidity.     Right lower leg: No edema.     Left lower leg: No edema.  Lymphadenopathy:     Cervical: No cervical adenopathy.  Skin:    Findings: No erythema or rash.  Neurological:     Cranial Nerves: No cranial nerve deficit.     Motor: No abnormal muscle tone.     Coordination: Coordination normal.     Deep Tendon Reflexes: Reflexes normal.  Psychiatric:        Behavior: Behavior normal.        Thought Content: Thought content normal.        Judgment: Judgment normal.     Lab Results  Component Value Date   WBC 6.1 12/11/2008   HGB 13.7 12/11/2008   HCT 39.4 12/11/2008   PLT 155 12/11/2008   GLUCOSE 95 12/11/2008   ALT 33 12/11/2008   AST 35 12/11/2008   NA 139 12/11/2008   K 4.2 12/11/2008   CL 105 12/11/2008   CREATININE 0.8 12/11/2008   BUN 18 12/11/2008   CO2 29 12/11/2008    CT Chest High Resolution  Result Date: 05/18/2022 CLINICAL DATA:  Shortness of breath and cough EXAM: CT CHEST WITHOUT CONTRAST TECHNIQUE: Multidetector CT imaging of the chest was performed following the standard protocol without intravenous contrast. High resolution imaging of the lungs, as well as inspiratory and expiratory imaging, was performed. RADIATION DOSE REDUCTION: This exam was performed according to the departmental dose-optimization program which includes automated exposure control, adjustment of the mA and/or kV according to patient size and/or use of iterative reconstruction technique. COMPARISON:  Cardiac CT dated May thirty-first 2022 FINDINGS: Cardiovascular: Normal heart size. No pericardial effusion. Normal caliber thoracic aorta with moderate calcified plaque. Moderate coronary artery calcifications of the LAD. Mediastinum/Nodes: Small hiatal hernia. Thyroid is unremarkable. No pathologically enlarged lymph nodes seen in the chest. Lungs/Pleura: Central airways are patent. Moderate air trapping. Biapical  pleural-parenchymal scarring. No consolidation, pleural effusion or pneumothorax. No evidence of fibrotic interstitial lung disease. New part solid nodule of the right middle lobe measuring 14 x 6 mm with 5 mm solid component on series 10 image 212. New right upper lobe solid pulmonary nodule located adjacent to the mediastinum measuring 5 mm in mean diameter on image 92. Stable small solid 4 mm nodule of the right lower lobe located on image 136. Upper Abdomen: No acute abnormality. Musculoskeletal: No chest wall mass or suspicious bone lesions identified. IMPRESSION: 1. No evidence of fibrotic interstitial lung disease. 2. New part solid nodule of the right middle  lobe measuring 10 mm in overall mean diameter with 5 mm solid component. Follow-up non-contrast CT recommended at 3-6 months to confirm persistence. If unchanged, and solid component remains <6 mm, annual CT is recommended until 5 years of stability has been established. If persistent these nodules should be considered highly suspicious if the solid component of the nodule is 6 mm or greater in size and enlarging. This recommendation follows the consensus statement: Guidelines for Management of Incidental Pulmonary Nodules Detected on CT Images: From the Fleischner Society 2017; Radiology 2017; 284:228-243. 3. Moderate air trapping, findings can be seen in the setting of small airways disease. 4. Aortic Atherosclerosis (ICD10-I70.0). Electronically Signed   By: Allegra Lai M.D.   On: 05/18/2022 20:28    Assessment & Plan:   Problem List Items Addressed This Visit     Migraines    Continue with Fioricet as needed, Zomig as needed      Relevant Medications   meloxicam (MOBIC) 7.5 MG tablet   Other Relevant Orders   CBC with Differential/Platelet   Comprehensive metabolic panel   Sedimentation rate   Vitamin B12   VITAMIN D 25 Hydroxy (Vit-D Deficiency, Fractures)   Urinalysis   TSH   T4, free   T3, free   Iron, TIBC and Ferritin  Panel   CK   Myalgia - Primary    Obtain CK      Relevant Orders   CBC with Differential/Platelet   Comprehensive metabolic panel   Sedimentation rate   Vitamin B12   VITAMIN D 25 Hydroxy (Vit-D Deficiency, Fractures)   Urinalysis   TSH   T4, free   T3, free   Iron, TIBC and Ferritin Panel   CK   Arthralgia    New.  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  Her rheumatoid tests were negative.  ANA was slightly elevated. Clerance Lav started Dosepak today by Ortho.  Knee x-rays were normal.  It is probably going to help her problem. I gave her prescription for meloxicam to use later. Obtain lab work including CBC, iron, c-Met      Relevant Orders   CBC with Differential/Platelet   Comprehensive metabolic panel   Sedimentation rate   Vitamin B12   VITAMIN D 25 Hydroxy (Vit-D Deficiency, Fractures)   Urinalysis   TSH   T4, free   T3, free   Iron, TIBC and Ferritin Panel   CK   Vitamin D deficiency    Continue with vitamin D.  Obtain vitamin D level      Relevant Orders   VITAMIN D 25 Hydroxy (Vit-D Deficiency, Fractures)      Meds ordered this encounter  Medications   meloxicam (MOBIC) 7.5 MG tablet    Sig: Take 1-2 tablets (7.5-15 mg total) by mouth daily.    Dispense:  60 tablet    Refill:  2   zolpidem (AMBIEN) 5 MG tablet    Sig: Take 1-2 tablets (5-10 mg total) by mouth at bedtime as needed for sleep.    Dispense:  60 tablet    Refill:  1      Follow-up: Return in about 3 months (around 02/20/2023) for a follow-up visit.  Sonda Primes, MD

## 2022-11-21 NOTE — Patient Instructions (Signed)
Blue-Emu cream -- use 2-3 times a day Ice/heat/ massage

## 2022-11-23 ENCOUNTER — Other Ambulatory Visit: Payer: Medicare Other

## 2022-11-26 NOTE — Progress Notes (Incomplete)
Subjective:  Patient ID: Whitney [name], female    DOB: 13-May-1955  Age: 67 y.o. MRN: [id number]  CC: Leg Pain   HPI Whitney [name] presents for fatigue, arthralgias. She is complaining of her knees being painful and locked x 5-10 min every morning.  Has been going on for several weeks.  It may have started with her prolonged cough episode following a viral infection. C/o fatigue, insomnia, arthralgia x weeks She started a steroid pack today S/p CTS surgery  Outpatient Medications Prior to Visit  Medication Sig Dispense Refill  . butalbital-acetaminophen-caffeine (FIORICET) 50-325-40 MG tablet butalbital-acetaminophen-caffeine 50 mg-325 mg-40 mg tablet  Take 1 tablet every 4 hours by oral route. 14 tablet 5  . CALCIUM PO Take by mouth.    . Cholecalciferol (VITAMIN D3) 50 MCG (2000 UT) capsule Take 1 capsule (2,000 Units total) by mouth daily. 100 capsule 3  . famotidine (PEPCID) 20 MG tablet Take 20 mg by mouth at bedtime.    [name] ipratropium (ATROVENT) 0.06 % nasal spray Place 2 sprays into both nostrils 4 (four) times daily as needed for rhinitis.    . LUTEIN PO Take 1 capsule by mouth daily.    . methocarbamol (ROBAXIN) 750 MG tablet Take 1 tablet (750 mg total) by mouth 4 (four) times daily. 60 tablet 6  . pantoprazole (PROTONIX) 40 MG tablet Take 40 mg by mouth as needed.    . traMADol (ULTRAM) 50 MG tablet Take 1 tablet (50 mg total) by mouth every 8 (eight) hours as needed. 90 tablet 1  . zolmitriptan (ZOMIG-ZMT) 5 MG disintegrating tablet Take 1 tablet (5 mg total) by mouth as needed for migraine. 30 tablet 4   No facility-administered medications prior to visit.    ROS: Review of Systems  Constitutional:  Negative for activity change, appetite change, chills, fatigue and unexpected weight change.  HENT:  Negative for congestion, mouth sores and sinus pressure.   Eyes:  Negative for visual disturbance.  Respiratory:  Negative for cough and chest tightness.    Gastrointestinal:  Negative for abdominal pain and nausea.  Genitourinary:  Negative for difficulty urinating, frequency and vaginal pain.  Musculoskeletal:  Positive for arthralgias. Negative for back pain and gait problem.  Skin:  Negative for pallor and rash.  Neurological:  Negative for dizziness, tremors, weakness, numbness and headaches.  Psychiatric/Behavioral:  Negative for confusion and sleep disturbance.     Objective:  BP 122/70 (BP Location: Right Arm, Patient Position: Sitting, Cuff Size: Large)   Pulse 92   Temp 98.6 F (37 C) (Oral)   Ht 5\' 6"  (1.676 m)   Wt 153 lb (69.4 kg)   SpO2 96%   BMI 24.69 kg/m   BP Readings from Last 3 Encounters:  11/21/22 122/70  08/03/22 122/70  05/21/22 130/64    Wt Readings from Last 3 Encounters:  11/21/22 153 lb (69.4 kg)  08/03/22 152 lb 12.8 oz (69.3 kg)  05/21/22 155 lb 6.4 oz (70.5 kg)    Physical Exam Constitutional:      General: She is not in acute distress.    Appearance: Normal appearance. She is well-developed.  HENT:     Head: Normocephalic.     Right Ear: External ear normal.     Left Ear: External ear normal.     Nose: Nose normal.  Eyes:     General:        Right eye: No discharge.  Left eye: No discharge.     Conjunctiva/sclera: Conjunctivae normal.     Pupils: Pupils are equal, round, and reactive to light.  Neck:     Thyroid: No thyromegaly.     Vascular: No JVD.     Trachea: No tracheal deviation.  Cardiovascular:     Rate and Rhythm: Normal rate and regular rhythm.     Heart sounds: Normal heart sounds.  Pulmonary:     Effort: No respiratory distress.     Breath sounds: No stridor. No wheezing.  Abdominal:     General: Bowel sounds are normal. There is no distension.     Palpations: Abdomen is soft. There is no mass.     Tenderness: There is no abdominal tenderness. There is no guarding or rebound.  Musculoskeletal:        General: No tenderness.     Cervical back: Normal range of  motion and neck supple. No rigidity.     Right lower leg: No edema.     Left lower leg: No edema.  Lymphadenopathy:     Cervical: No cervical adenopathy.  Skin:    Findings: No erythema or rash.  Neurological:     Cranial Nerves: No cranial nerve deficit.     Motor: No abnormal muscle tone.     Coordination: Coordination normal.     Deep Tendon Reflexes: Reflexes normal.  Psychiatric:        Behavior: Behavior normal.        Thought Content: Thought content normal.        Judgment: Judgment normal.     Lab Results  Component Value Date   WBC 6.1 12/11/2008   HGB 13.7 12/11/2008   HCT 39.4 12/11/2008   PLT 155 12/11/2008   GLUCOSE 95 12/11/2008   ALT 33 12/11/2008   AST 35 12/11/2008   NA 139 12/11/2008   K 4.2 12/11/2008   CL 105 12/11/2008   CREATININE 0.8 12/11/2008   BUN 18 12/11/2008   CO2 29 12/11/2008    CT Chest High Resolution  Result Date: 05/18/2022 CLINICAL DATA:  Shortness of breath and cough EXAM: CT CHEST WITHOUT CONTRAST TECHNIQUE: Multidetector CT imaging of the chest was performed following the standard protocol without intravenous contrast. High resolution imaging of the lungs, as well as inspiratory and expiratory imaging, was performed. RADIATION DOSE REDUCTION: This exam was performed according to the departmental dose-optimization program which includes automated exposure control, adjustment of the mA and/or kV according to patient size and/or use of iterative reconstruction technique. COMPARISON:  Cardiac CT dated May thirty-first 2022 FINDINGS: Cardiovascular: Normal heart size. No pericardial effusion. Normal caliber thoracic aorta with moderate calcified plaque. Moderate coronary artery calcifications of the LAD. Mediastinum/Nodes: Small hiatal hernia. Thyroid is unremarkable. No pathologically enlarged lymph nodes seen in the chest. Lungs/Pleura: Central airways are patent. Moderate air trapping. Biapical pleural-parenchymal scarring. No consolidation,  pleural effusion or pneumothorax. No evidence of fibrotic interstitial lung disease. New part solid nodule of the right middle lobe measuring 14 x 6 mm with 5 mm solid component on series 10 image 212. New right upper lobe solid pulmonary nodule located adjacent to the mediastinum measuring 5 mm in mean diameter on image 92. Stable small solid 4 mm nodule of the right lower lobe located on image 136. Upper Abdomen: No acute abnormality. Musculoskeletal: No chest wall mass or suspicious bone lesions identified. IMPRESSION: 1. No evidence of fibrotic interstitial lung disease. 2. New part solid nodule of the right middle  lobe measuring 10 mm in overall mean diameter with 5 mm solid component. Follow-up non-contrast CT recommended at 3-6 months to confirm persistence. If unchanged, and solid component remains <6 mm, annual CT is recommended until 5 years of stability has been established. If persistent these nodules should be considered highly suspicious if the solid component of the nodule is 6 mm or greater in size and enlarging. This recommendation follows the consensus statement: Guidelines for Management of Incidental Pulmonary Nodules Detected on CT Images: From the Fleischner Society 2017; Radiology 2017; 284:228-243. 3. Moderate air trapping, findings can be seen in the setting of small airways disease. 4. Aortic Atherosclerosis (ICD10-I70.0). Electronically Signed   By: Allegra Lai M.D.   On: 05/18/2022 20:28    Assessment & Plan:   Problem List Items Addressed This Visit     Migraines    Continue with Fioricet as needed, Zomig as needed      Relevant Medications   meloxicam (MOBIC) 7.5 MG tablet   Other Relevant Orders   CBC with Differential/Platelet   Comprehensive metabolic panel   Sedimentation rate   Vitamin B12   VITAMIN D 25 Hydroxy (Vit-D Deficiency, Fractures)   Urinalysis   TSH   T4, free   T3, free   Iron, TIBC and Ferritin Panel   CK   Myalgia - Primary    Obtain  CK      Relevant Orders   CBC with Differential/Platelet   Comprehensive metabolic panel   Sedimentation rate   Vitamin B12   VITAMIN D 25 Hydroxy (Vit-D Deficiency, Fractures)   Urinalysis   TSH   T4, free   T3, free   Iron, TIBC and Ferritin Panel   CK   Arthralgia    New.  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  Her rheumatoid tests were negative.  ANA was slightly elevated. Clerance Lav started [place] today.  It is probably going to help her problem. I gave her prescription for meloxicam to use later. Obtain lab work including CBC, iron, c-Met      Relevant Orders   CBC with Differential/Platelet   Comprehensive metabolic panel   Sedimentation rate   Vitamin B12   VITAMIN D 25 Hydroxy (Vit-D Deficiency, Fractures)   Urinalysis   TSH   T4, free   T3, free   Iron, TIBC and Ferritin Panel   CK   Vitamin D deficiency    Continue with vitamin D.  Obtain vitamin D level      Relevant Orders   VITAMIN D 25 Hydroxy (Vit-D Deficiency, Fractures)      Meds ordered this encounter  Medications  . meloxicam (MOBIC) 7.5 MG tablet    Sig: Take 1-2 tablets (7.5-15 mg total) by mouth daily.    Dispense:  60 tablet    Refill:  2  . zolpidem (AMBIEN) 5 MG tablet    Sig: Take 1-2 tablets (5-10 mg total) by mouth at bedtime as needed for sleep.    Dispense:  60 tablet    Refill:  1      Follow-up: Return in about 3 months (around 02/20/2023) for a follow-up visit.  Sonda Primes, MD

## 2022-11-27 ENCOUNTER — Ambulatory Visit
Admission: RE | Admit: 2022-11-27 | Discharge: 2022-11-27 | Disposition: A | Discharge status: Home / Self Care | Payer: Medicare Other | Source: Ambulatory Visit | Attending: Internal Medicine | Admitting: Internal Medicine

## 2022-11-27 DIAGNOSIS — M2550 Pain in unspecified joint: Secondary | ICD-10-CM | POA: Insufficient documentation

## 2022-11-27 DIAGNOSIS — R918 Other nonspecific abnormal finding of lung field: Secondary | ICD-10-CM

## 2022-11-27 DIAGNOSIS — E559 Vitamin D deficiency, unspecified: Secondary | ICD-10-CM | POA: Insufficient documentation

## 2022-11-27 DIAGNOSIS — M791 Myalgia, unspecified site: Secondary | ICD-10-CM | POA: Insufficient documentation

## 2022-11-27 NOTE — Assessment & Plan Note (Signed)
Obtain CK

## 2022-11-27 NOTE — Assessment & Plan Note (Signed)
Continue with Fioricet as needed, Zomig as needed

## 2022-11-27 NOTE — Assessment & Plan Note (Signed)
Continue with vitamin D.  Obtain vitamin D level

## 2022-11-27 NOTE — Assessment & Plan Note (Signed)
New.  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  Her rheumatoid tests were negative.  ANA was slightly elevated. Clerance Lav started [place] today.  It is probably going to help her problem. I gave her prescription for meloxicam to use later. Obtain lab work including CBC, iron, c-Met

## 2022-12-07 ENCOUNTER — Other Ambulatory Visit (INDEPENDENT_AMBULATORY_CARE_PROVIDER_SITE_OTHER): Payer: Medicare Other

## 2022-12-07 DIAGNOSIS — M791 Myalgia, unspecified site: Secondary | ICD-10-CM

## 2022-12-07 DIAGNOSIS — E559 Vitamin D deficiency, unspecified: Secondary | ICD-10-CM

## 2022-12-07 DIAGNOSIS — G43709 Chronic migraine without aura, not intractable, without status migrainosus: Secondary | ICD-10-CM | POA: Diagnosis not present

## 2022-12-07 DIAGNOSIS — M2550 Pain in unspecified joint: Secondary | ICD-10-CM | POA: Diagnosis not present

## 2022-12-07 LAB — CBC WITH DIFFERENTIAL/PLATELET
Basophils Absolute: 0 10*3/uL (ref 0.0–0.1)
Basophils Relative: 0.2 % (ref 0.0–3.0)
Eosinophils Absolute: 0 10*3/uL (ref 0.0–0.7)
Eosinophils Relative: 0.8 % (ref 0.0–5.0)
HCT: 38.5 % (ref 36.0–46.0)
Hemoglobin: 12.8 g/dL (ref 12.0–15.0)
Lymphocytes Relative: 34.1 % (ref 12.0–46.0)
Lymphs Abs: 2.2 10*3/uL (ref 0.7–4.0)
MCHC: 33.2 g/dL (ref 30.0–36.0)
MCV: 92.6 fL (ref 78.0–100.0)
Monocytes Absolute: 0.3 10*3/uL (ref 0.1–1.0)
Monocytes Relative: 4.7 % (ref 3.0–12.0)
Neutro Abs: 3.8 10*3/uL (ref 1.4–7.7)
Neutrophils Relative %: 60.2 % (ref 43.0–77.0)
Platelets: 193 10*3/uL (ref 150.0–400.0)
RBC: 4.15 Mil/uL (ref 3.87–5.11)
RDW: 13.6 % (ref 11.5–15.5)
WBC: 6.3 10*3/uL (ref 4.0–10.5)

## 2022-12-07 LAB — T3, FREE: T3, Free: 3 pg/mL (ref 2.3–4.2)

## 2022-12-07 LAB — CK: Total CK: 36 U/L (ref 7–177)

## 2022-12-07 LAB — COMPREHENSIVE METABOLIC PANEL
ALT: 15 U/L (ref 0–35)
AST: 20 U/L (ref 0–37)
Albumin: 3.8 g/dL (ref 3.5–5.2)
Alkaline Phosphatase: 51 U/L (ref 39–117)
BUN: 15 mg/dL (ref 6–23)
CO2: 30 meq/L (ref 19–32)
Calcium: 9.4 mg/dL (ref 8.4–10.5)
Chloride: 101 meq/L (ref 96–112)
Creatinine, Ser: 0.61 mg/dL (ref 0.40–1.20)
GFR: 93.32 mL/min (ref >60.00)
Glucose, Bld: 87 mg/dL (ref 70–99)
Potassium: 3.9 meq/L (ref 3.5–5.1)
Sodium: 138 meq/L (ref 135–145)
Total Bilirubin: 0.5 mg/dL (ref 0.2–1.2)
Total Protein: 6.8 g/dL (ref 6.0–8.3)

## 2022-12-07 LAB — VITAMIN D 25 HYDROXY (VIT D DEFICIENCY, FRACTURES): VITD: 29.69 ng/mL — ABNORMAL LOW (ref 30.00–100.00)

## 2022-12-07 LAB — T4, FREE: Free T4: 0.94 ng/dL (ref 0.60–1.60)

## 2022-12-07 LAB — VITAMIN B12: Vitamin B-12: 473 pg/mL (ref 211–911)

## 2022-12-07 LAB — TSH: TSH: 0.64 u[IU]/mL (ref 0.35–5.50)

## 2022-12-07 LAB — SEDIMENTATION RATE: Sed Rate: 25 mm/h (ref 0–30)

## 2022-12-08 ENCOUNTER — Other Ambulatory Visit: Payer: Self-pay | Admitting: Internal Medicine

## 2022-12-08 DIAGNOSIS — M2550 Pain in unspecified joint: Secondary | ICD-10-CM

## 2022-12-08 LAB — IRON,TIBC AND FERRITIN PANEL
%SAT: 26 % (ref 16–45)
Ferritin: 98 ng/mL (ref 16–288)
Iron: 70 ug/dL (ref 45–160)
TIBC: 267 ug/dL (ref 250–450)

## 2022-12-10 ENCOUNTER — Encounter: Payer: Self-pay | Admitting: Internal Medicine

## 2022-12-10 ENCOUNTER — Ambulatory Visit (INDEPENDENT_AMBULATORY_CARE_PROVIDER_SITE_OTHER): Payer: Medicare Other | Admitting: Internal Medicine

## 2022-12-10 VITALS — BP 134/72 | HR 79 | Temp 98.2°F | Ht 66.0 in | Wt 138.0 lb

## 2022-12-10 DIAGNOSIS — M2550 Pain in unspecified joint: Secondary | ICD-10-CM | POA: Diagnosis not present

## 2022-12-10 DIAGNOSIS — K219 Gastro-esophageal reflux disease without esophagitis: Secondary | ICD-10-CM | POA: Diagnosis not present

## 2022-12-10 DIAGNOSIS — G4700 Insomnia, unspecified: Secondary | ICD-10-CM

## 2022-12-10 MED ORDER — ZOLPIDEM TARTRATE 10 MG PO TABS
5.0000 mg | ORAL_TABLET | Freq: Every evening | ORAL | 3 refills | Status: AC | PRN
Start: 2022-12-10 — End: ?

## 2022-12-10 MED ORDER — PREDNISONE 5 MG PO TABS: ORAL_TABLET | ORAL | 0 refills | Status: DC

## 2022-12-10 NOTE — Progress Notes (Unsigned)
Subjective:  Patient ID: [name], female    DOB: [birth year]  Age: 67 y.o. MRN: [id number]  CC: Muscle Pain (Muscle stiffness)   HPI Chales Abrahams C Bow presents for arthralgias Steroids helped Meloxicam did not help Dr Amanda Pea   Outpatient Medications Prior to Visit  Medication Sig Dispense Refill  . butalbital-acetaminophen-caffeine (FIORICET) 50-325-40 MG tablet butalbital-acetaminophen-caffeine 50 mg-325 mg-40 mg tablet  Take 1 tablet every 4 hours by oral route. 14 tablet 5  . CALCIUM PO Take by mouth.    . Cholecalciferol (VITAMIN D3) 50 MCG (2000 UT) capsule Take 1 capsule (2,000 Units total) by mouth daily. 100 capsule 3  . famotidine (PEPCID) 20 MG tablet Take 20 mg by mouth at bedtime.    [name] ipratropium (ATROVENT) 0.06 % nasal spray Place 2 sprays into both nostrils 4 (four) times daily as needed for rhinitis.    . LUTEIN PO Take 1 capsule by mouth daily.    . meloxicam (MOBIC) 7.5 MG tablet Take 1-2 tablets (7.5-15 mg total) by mouth daily. 60 tablet 2  . methocarbamol (ROBAXIN) 750 MG tablet Take 1 tablet (750 mg total) by mouth 4 (four) times daily. 60 tablet 6  . pantoprazole (PROTONIX) 40 MG tablet Take 40 mg by mouth as needed.    . predniSONE (DELTASONE) 10 MG tablet Take 10 mg by mouth daily.    . traMADol (ULTRAM) 50 MG tablet Take 1 tablet (50 mg total) by mouth every 8 (eight) hours as needed. 90 tablet 1  . zolmitriptan (ZOMIG-ZMT) 5 MG disintegrating tablet Take 1 tablet (5 mg total) by mouth as needed for migraine. 30 tablet 4  . zolpidem (AMBIEN) 5 MG tablet Take 1-2 tablets (5-10 mg total) by mouth at bedtime as needed for sleep. 60 tablet 1   No facility-administered medications prior to visit.    ROS: Review of Systems  Constitutional:  Negative for activity change, appetite change, chills, fatigue and unexpected weight change.  HENT:  Negative for congestion, mouth sores and sinus pressure.   Eyes:  Negative for visual disturbance.   Respiratory:  Negative for cough and chest tightness.   Gastrointestinal:  Negative for abdominal pain and nausea.  Genitourinary:  Negative for difficulty urinating, frequency and vaginal pain.  Musculoskeletal:  Negative for back pain and gait problem.  Skin:  Negative for pallor and rash.  Neurological:  Negative for dizziness, tremors, weakness, numbness and headaches.  Psychiatric/Behavioral:  Negative for confusion, sleep disturbance and suicidal ideas.     Objective:  BP 134/72 (BP Location: Left Arm, Patient Position: Sitting, Cuff Size: Normal)   Pulse 79   Temp 98.2 F (36.8 C) (Oral)   Ht 5\' 6"  (1.676 m)   Wt 138 lb (62.6 kg)   SpO2 98%   BMI 22.27 kg/m   BP Readings from Last 3 Encounters:  12/10/22 134/72  11/21/22 122/70  08/03/22 122/70    Wt Readings from Last 3 Encounters:  12/10/22 138 lb (62.6 kg)  11/21/22 153 lb (69.4 kg)  08/03/22 152 lb 12.8 oz (69.3 kg)    Physical Exam Constitutional:      General: She is not in acute distress.    Appearance: She is well-developed.  HENT:     Head: Normocephalic.     Right Ear: External ear normal.     Left Ear: External ear normal.     Nose: Nose normal.  Eyes:     General:  Right eye: No discharge.        Left eye: No discharge.     Conjunctiva/sclera: Conjunctivae normal.     Pupils: Pupils are equal, round, and reactive to light.  Neck:     Thyroid: No thyromegaly.     Vascular: No JVD.     Trachea: No tracheal deviation.  Cardiovascular:     Rate and Rhythm: Normal rate and regular rhythm.     Heart sounds: Normal heart sounds.  Pulmonary:     Effort: No respiratory distress.     Breath sounds: No stridor. No wheezing.  Abdominal:     General: Bowel sounds are normal. There is no distension.     Palpations: Abdomen is soft. There is no mass.     Tenderness: There is no abdominal tenderness. There is no guarding or rebound.  Musculoskeletal:        General: No tenderness.     Cervical  back: Normal range of motion and neck supple. No rigidity.  Lymphadenopathy:     Cervical: No cervical adenopathy.  Skin:    Findings: No erythema or rash.  Neurological:     Cranial Nerves: No cranial nerve deficit.     Motor: No abnormal muscle tone.     Coordination: Coordination normal.     Deep Tendon Reflexes: Reflexes normal.  Psychiatric:        Behavior: Behavior normal.        Thought Content: Thought content normal.        Judgment: Judgment normal.    Lab Results  Component Value Date   WBC 6.3 12/07/2022   HGB 12.8 12/07/2022   HCT 38.5 12/07/2022   PLT 193.0 12/07/2022   GLUCOSE 87 12/07/2022   ALT 15 12/07/2022   AST 20 12/07/2022   NA 138 12/07/2022   K 3.9 12/07/2022   CL 101 12/07/2022   CREATININE 0.61 12/07/2022   BUN 15 12/07/2022   CO2 30 12/07/2022   TSH 0.64 12/07/2022    No results found.  Assessment & Plan:   Problem List Items Addressed This Visit   None     Meds ordered this encounter  Medications  . predniSONE (DELTASONE) 5 MG tablet    Sig: As dirrected    Dispense:  100 tablet    Refill:  0  . zolpidem (AMBIEN) 10 MG tablet    Sig: Take 0.5-1 tablets (5-10 mg total) by mouth at bedtime as needed for sleep.    Dispense:  30 tablet    Refill:  3      Follow-up: Return in about 3 months (around 03/12/2023).  Sonda Primes, MD

## 2022-12-11 ENCOUNTER — Encounter: Payer: Self-pay | Admitting: Internal Medicine

## 2022-12-11 DIAGNOSIS — G4700 Insomnia, unspecified: Secondary | ICD-10-CM | POA: Insufficient documentation

## 2022-12-11 NOTE — Assessment & Plan Note (Signed)
On a PPI - Protonix  Potential benefits of a long term PPI use as well as potential risks  and complications were explained to the patient and were aknowledged.

## 2022-12-11 NOTE — Assessment & Plan Note (Signed)
Options discussed.  Zolpidem low-dose as needed, rare use  Potential benefits of a long term benzodiazepines  use as well as potential risks  and complications were explained to the patient and were aknowledged.

## 2022-12-11 NOTE — Assessment & Plan Note (Addendum)
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  Steroids helped a lot with arthralgias and stiffness in the joints Meloxicam did not help at all... Will try steroids with caution.  Will see if low-dose prednisone would give good results first -Whitney [name] will take 10 mg daily for couple days.  If not better, she will go to 30 mg a day. Will taper off over a period of time.  Will maintain on the lowest dose that does the job. She will take vitamin D3+K2  Potential benefits of a long term steroid  use as well as potential risks  and complications were explained to the patient and were aknowledged. On a PPI - Protonix  Potential benefits of a long term PPI use as well as potential risks  and complications were explained to the patient and were aknowledged. Return to clinic in 3 months

## 2022-12-17 ENCOUNTER — Telehealth: Payer: Self-pay | Admitting: Internal Medicine

## 2022-12-17 ENCOUNTER — Ambulatory Visit (INDEPENDENT_AMBULATORY_CARE_PROVIDER_SITE_OTHER): Payer: Medicare Other | Admitting: Neurology

## 2022-12-17 DIAGNOSIS — R918 Other nonspecific abnormal finding of lung field: Secondary | ICD-10-CM

## 2022-12-17 DIAGNOSIS — G43709 Chronic migraine without aura, not intractable, without status migrainosus: Secondary | ICD-10-CM | POA: Diagnosis not present

## 2022-12-17 MED ORDER — ONABOTULINUMTOXINA 200 UNITS IJ SOLR
155.0000 [IU] | Freq: Once | INTRAMUSCULAR | Status: AC
Start: 2022-12-17 — End: 2022-12-17
  Administered 2022-12-17: 155 [IU] via INTRAMUSCULAR

## 2022-12-17 NOTE — Progress Notes (Signed)
Botox- 200 units x 1 vial Lot: [id number] Expiration: 04/2025 NDC: 5409-8119-14  Bacteriostatic 0.9% Sodium Chloride- 4mL  Lot: [id number] Expiration: 06/04/2023 NDC: 2130-8657-84  Dx: O96.295 B/B Witnessed by Elana Alm

## 2022-12-17 NOTE — Telephone Encounter (Signed)
Order placed nfn

## 2022-12-17 NOTE — Telephone Encounter (Signed)
Patient is returning phone call for CT results.Would like to speak to Dr. Sherene Sires about CT results. Patient phone number is [phone].

## 2022-12-17 NOTE — Telephone Encounter (Signed)
Place order for f/u CT chest s contrast for 6 months from now dx multiple pulmonary nodules

## 2022-12-17 NOTE — Progress Notes (Signed)
Consent Form Botulism Toxin Injection For Chronic Migraine  12/17/2022: >>>80% decrease in freg and severity of migraines and headaches since being on botox. Stable.  09/24/2022: doing great 06/26/2022: Stable, still doing great.  04/02/2022: Transfer of care from Dr. Antonietta Jewel.   Doing great on botox. Only 4 migraines a month >>>80% decrease in freg and severity of migraines and headaches since being on botox. Gave her samples of ubrelvy and nurtec to try as well, explained nurtec can be used acutely and preventatively or in the last 1-2 weeks when botox is wearing off to avoid breakthrough. Zomig works well acutely  No orders of the defined types were placed in this encounter.      Reviewed orally with patient, additionally signature is on file:  Botulism toxin has been approved by the Federal drug administration for treatment of chronic migraine. Botulism toxin does not cure chronic migraine and it may not be effective in some patients.  The administration of botulism toxin is accomplished by injecting a small amount of toxin into the muscles of the neck and head. Dosage must be titrated for each individual. Any benefits resulting from botulism toxin tend to wear off after 3 months with a repeat injection required if benefit is to be maintained. Injections are usually done every 3-4 months with maximum effect peak achieved by about 2 or 3 weeks. Botulism toxin is expensive and you should be sure of what costs you will incur resulting from the injection.  The side effects of botulism toxin use for chronic migraine may include:   -Transient, and usually mild, facial weakness with facial injections  -Transient, and usually mild, head or neck weakness with head/neck injections  -Reduction or loss of forehead facial animation due to forehead muscle weakness  -Eyelid drooping  -Dry eye  -Pain at the site of injection or bruising at the site of injection  -Double vision  -Potential unknown long  term risks  Contraindications: You should not have Botox if you are pregnant, nursing, allergic to albumin, have an infection, skin condition, or muscle weakness at the site of the injection, or have myasthenia gravis, Lambert-Eaton syndrome, or ALS.  It is also possible that as with any injection, there may be an allergic reaction or no effect from the medication. Reduced effectiveness after repeated injections is sometimes seen and rarely infection at the injection site may occur. All care will be taken to prevent these side effects. If therapy is given over a long time, atrophy and wasting in the muscle injected may occur. Occasionally the patient's become refractory to treatment because they develop antibodies to the toxin. In this event, therapy needs to be modified.  I have read the above information and consent to the administration of botulism toxin.    BOTOX PROCEDURE NOTE FOR MIGRAINE HEADACHE    Contraindications and precautions discussed with patient(above). Aseptic procedure was observed and patient tolerated procedure. Procedure performed by Dr. Artemio Aly  The condition has existed for more than 6 months, and pt does not have a diagnosis of ALS, Myasthenia Gravis or Lambert-Eaton Syndrome.  Risks and benefits of injections discussed and pt agrees to proceed with the procedure.  Written consent obtained  These injections are medically necessary. Pt  receives good benefits from these injections. These injections do not cause sedations or hallucinations which the oral therapies may cause.  Description of procedure:  The patient was placed in a sitting position. The standard protocol was used for Botox as follows, with 5 units  of Botox injected at each site:   -Procerus muscle, midline injection  -Corrugator muscle, bilateral injection  -Frontalis muscle, bilateral injection, with 2 sites each side, medial injection was performed in the upper one third of the frontalis muscle,  in the region vertical from the medial inferior edge of the superior orbital rim. The lateral injection was again in the upper one third of the forehead vertically above the lateral limbus of the cornea, 1.5 cm lateral to the medial injection site.  -Temporalis muscle injection, 4 sites, bilaterally. The first injection was 3 cm above the tragus of the ear, second injection site was 1.5 cm to 3 cm up from the first injection site in line with the tragus of the ear. The third injection site was 1.5-3 cm forward between the first 2 injection sites. The fourth injection site was 1.5 cm posterior to the second injection site.   -Occipitalis muscle injection, 3 sites, bilaterally. The first injection was done one half way between the occipital protuberance and the tip of the mastoid process behind the ear. The second injection site was done lateral and superior to the first, 1 fingerbreadth from the first injection. The third injection site was 1 fingerbreadth superiorly and medially from the first injection site.  -Cervical paraspinal muscle injection, 2 sites, bilateral knee first injection site was 1 cm from the midline of the cervical spine, 3 cm inferior to the lower border of the occipital protuberance. The second injection site was 1.5 cm superiorly and laterally to the first injection site.  -Trapezius muscle injection was performed at 3 sites, bilaterally. The first injection site was in the upper trapezius muscle halfway between the inflection point of the neck, and the acromion. The second injection site was one half way between the acromion and the first injection site. The third injection was done between the first injection site and the inflection point of the neck.   Will return for repeat injection in 3 months.   200 units of Botox was used, 45 U Botox not injected was wasted. The patient tolerated the procedure well, there were no complications of the above procedure.

## 2022-12-17 NOTE — Telephone Encounter (Signed)
Called and spoke with pt, who would only like to speak to Dr.wert

## 2022-12-20 ENCOUNTER — Encounter: Payer: Self-pay | Admitting: Internal Medicine

## 2022-12-20 ENCOUNTER — Ambulatory Visit: Payer: Medicare Other | Admitting: Internal Medicine

## 2022-12-20 ENCOUNTER — Other Ambulatory Visit: Payer: Self-pay | Admitting: Otolaryngology

## 2022-12-20 DIAGNOSIS — D440 Neoplasm of uncertain behavior of thyroid gland: Secondary | ICD-10-CM

## 2022-12-20 DIAGNOSIS — I2510 Atherosclerotic heart disease of native coronary artery without angina pectoris: Secondary | ICD-10-CM | POA: Insufficient documentation

## 2022-12-20 DIAGNOSIS — I700 Atherosclerosis of aorta: Secondary | ICD-10-CM | POA: Insufficient documentation

## 2022-12-21 ENCOUNTER — Other Ambulatory Visit: Payer: Self-pay | Admitting: Otolaryngology

## 2022-12-21 ENCOUNTER — Other Ambulatory Visit: Payer: Self-pay | Admitting: Internal Medicine

## 2022-12-21 DIAGNOSIS — D440 Neoplasm of uncertain behavior of thyroid gland: Secondary | ICD-10-CM

## 2022-12-24 NOTE — Progress Notes (Unsigned)
-----   Message -----  From: Irish Lack, MD  Sent: 12/24/2022   8:27 AM EDT  To: Dorise Hiss  Subject: RE: [place] FNA Thyroid                             Needs to have a formal thyroid US so we can characterize and measure the thyroid nodule.   GY  ----- Message -----  From: Leodis Rains D  Sent: 12/21/2022   8:28 AM EDT  To: [facility] Procedure Requests  Subject: [place] FNA Thyroid                                 Good morning!  This is a message from the ordering provider Dr [name].  Please advise if OK to send this to [facility] for [place] FNA Thyroid BX?    She had a Chest CT scan. 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

## 2022-12-25 ENCOUNTER — Other Ambulatory Visit: Payer: Self-pay | Admitting: Obstetrics and Gynecology

## 2022-12-25 DIAGNOSIS — Z1231 Encounter for screening mammogram for malignant neoplasm of breast: Secondary | ICD-10-CM

## 2022-12-28 ENCOUNTER — Ambulatory Visit (HOSPITAL_COMMUNITY)
Admission: RE | Admit: 2022-12-28 | Discharge: 2022-12-28 | Disposition: A | Discharge status: Home / Self Care | Payer: Medicare Other | Source: Ambulatory Visit | Attending: Otolaryngology | Admitting: Otolaryngology

## 2022-12-28 DIAGNOSIS — D440 Neoplasm of uncertain behavior of thyroid gland: Secondary | ICD-10-CM | POA: Diagnosis present

## 2023-01-03 ENCOUNTER — Ambulatory Visit (INDEPENDENT_AMBULATORY_CARE_PROVIDER_SITE_OTHER): Payer: Medicare Other | Admitting: Otolaryngology

## 2023-01-03 ENCOUNTER — Encounter (INDEPENDENT_AMBULATORY_CARE_PROVIDER_SITE_OTHER): Payer: Self-pay

## 2023-01-03 VITALS — Ht 66.0 in | Wt 140.0 lb

## 2023-01-03 DIAGNOSIS — D440 Neoplasm of uncertain behavior of thyroid gland: Secondary | ICD-10-CM | POA: Insufficient documentation

## 2023-01-03 NOTE — Progress Notes (Signed)
Patient ID: Whitney [name], female   DOB: 08/20/1955, 67 y.o.   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  According to the patient, she recently underwent a chest CT scan.  She was noted to have a right thyroid nodule on the CT scan.  She subsequently underwent a neck ultrasound.  The ultrasound showed multiple thyroid nodules.  The largest 2.6 cm nodule within the right superior lobe meets the criteria for fine-needle aspiration biopsy.  The patient also has multiple other subcentimeter nodules that do not meet criteria for FNA.  Currently the patient is asymptomatic.  She denies any dysphagia or odynophagia.  She has no previous thyroid surgery.  Exam: General: Communicates without difficulty, well nourished, no acute distress. Head: Normocephalic, no evidence injury, no tenderness, facial buttresses intact without stepoff. Face/sinus: No tenderness to palpation and percussion. Facial movement is normal and symmetric. Eyes: PERRL, EOMI. No scleral icterus, conjunctivae clear. Neuro: CN II exam reveals vision grossly intact.  No nystagmus at any point of gaze. Ears: Auricles well formed without lesions.  Ear canals are intact without mass or lesion.  No erythema or edema is appreciated.  The TMs are intact without fluid. Nose: External evaluation reveals normal support and skin without lesions.  Dorsum is intact.  Anterior rhinoscopy reveals normal mucosa over anterior aspect of inferior turbinates and intact septum.  No purulence noted. Oral:  Oral cavity and oropharynx are intact, symmetric, without erythema or edema.  Mucosa is moist without lesions. Neck: Full range of motion without pain.  There is no significant lymphadenopathy.  No masses palpable.  Thyroid bed within normal limits to palpation.  Parotid glands and submandibular glands equal bilaterally without mass.   Trachea is midline. Neuro:  CN 2-12 grossly intact.    Assessment: 1.  The patient has a 2.6 cm right superior thyroid nodule that needs criteria for FNA. 2.  She also has multiple small nodules that do not meet criteria for FNA. 3.  Her thyroid nodules are not palpable.  She is currently asymptomatic.  Plan: 1.  The physical exam findings and the ultrasound results are reviewed with the patient. 2.  Ultrasound-guided fine-needle aspiration biopsy of right thyroid nodule. 3.  The patient will return for follow-up after the biopsy.

## 2023-01-21 ENCOUNTER — Ambulatory Visit
Admission: RE | Admit: 2023-01-21 | Discharge: 2023-01-21 | Disposition: A | Discharge status: Home / Self Care | Payer: Medicare Other | Source: Ambulatory Visit | Attending: Obstetrics and Gynecology | Admitting: Obstetrics and Gynecology

## 2023-01-21 DIAGNOSIS — Z1231 Encounter for screening mammogram for malignant neoplasm of breast: Secondary | ICD-10-CM

## 2023-01-25 ENCOUNTER — Ambulatory Visit (HOSPITAL_COMMUNITY)
Admission: RE | Admit: 2023-01-25 | Discharge: 2023-01-25 | Disposition: A | Discharge status: Home / Self Care | Payer: Medicare Other | Source: Ambulatory Visit | Attending: Otolaryngology | Admitting: Otolaryngology

## 2023-01-25 DIAGNOSIS — D440 Neoplasm of uncertain behavior of thyroid gland: Secondary | ICD-10-CM

## 2023-01-25 DIAGNOSIS — E041 Nontoxic single thyroid nodule: Secondary | ICD-10-CM | POA: Diagnosis not present

## 2023-01-25 MED ORDER — LIDOCAINE-EPINEPHRINE 1 %-1:100000 IJ SOLN
3.0000 mL | Freq: Once | INTRAMUSCULAR | Status: AC
  Administered 2023-01-25: 3 mL via INTRADERMAL

## 2023-01-29 ENCOUNTER — Other Ambulatory Visit (INDEPENDENT_AMBULATORY_CARE_PROVIDER_SITE_OTHER): Payer: Self-pay | Admitting: Otolaryngology

## 2023-01-29 LAB — CYTOLOGY - NON PAP

## 2023-01-29 MED ORDER — FLUTICASONE PROPIONATE 50 MCG/ACT NA SUSP
2.0000 | Freq: Every day | NASAL | 0 refills | Status: DC
Start: 2023-01-29 — End: 2023-08-26

## 2023-03-11 ENCOUNTER — Ambulatory Visit (INDEPENDENT_AMBULATORY_CARE_PROVIDER_SITE_OTHER): Payer: Medicare Other | Admitting: Neurology

## 2023-03-11 ENCOUNTER — Telehealth: Payer: Self-pay | Admitting: Neurology

## 2023-03-11 DIAGNOSIS — G43709 Chronic migraine without aura, not intractable, without status migrainosus: Secondary | ICD-10-CM | POA: Diagnosis not present

## 2023-03-11 MED ORDER — ONABOTULINUMTOXINA 200 UNITS IJ SOLR
155.0000 [IU] | Freq: Once | INTRAMUSCULAR | Status: AC
  Administered 2023-03-11: 155 [IU] via INTRAMUSCULAR

## 2023-03-11 NOTE — Telephone Encounter (Signed)
 Pt called have had a patient emergency and will be arriving for appt between 4:30pm and 4:45pm. Stated,  have spoken to Dr.Ahern about this already. Informed pt may have to reschedule appt. Verbalized understand.

## 2023-03-11 NOTE — Progress Notes (Signed)
 Botox- 200 units x 1 vial Lot: [id number] Expiration: 05/2025 NDC: 1478-2956-21  Bacteriostatic 0.9% Sodium Chloride- 4 mL  Lot: [id number] Expiration: 01/04/2024 NDC: 8469-6295-28  Dx: U13.244 B/B Witnessed by Truitt Leep, RN

## 2023-03-11 NOTE — Progress Notes (Signed)
 Consent Form Botulism Toxin Injection For Chronic Migraine  03/11/2023: >>>80% decrease in freg and severity of migraines and headaches since being on botox . Stable.  12/17/2022: >>>80% decrease in freg and severity of migraines and headaches since being on botox . Stable.  09/24/2022: doing great 06/26/2022: Stable, still doing great.  04/02/2022: Transfer of care from Dr. Chalice.   Doing great on botox . Only 4 migraines a month >>>80% decrease in freg and severity of migraines and headaches since being on botox . Gave her samples of ubrelvy  and nurtec to try as well, explained nurtec can be used acutely and preventatively or in the last 1-2 weeks when botox  is wearing off to avoid breakthrough. Zomig  works well acutely  Meds ordered this encounter  Medications   botulinum toxin Type A  (BOTOX ) injection 155 Units    Botox - 200 units x 1 vial Lot: [id number] Expiration: 05/2025 NDC: 9976-6078-97 B/B       Reviewed orally with patient, additionally signature is on file:  Botulism toxin has been approved by the Federal drug administration for treatment of chronic migraine. Botulism toxin does not cure chronic migraine and it may not be effective in some patients.  The administration of botulism toxin is accomplished by injecting a small amount of toxin into the muscles of the neck and head. Dosage must be titrated for each individual. Any benefits resulting from botulism toxin tend to wear off after 3 months with a repeat injection required if benefit is to be maintained. Injections are usually done every 3-4 months with maximum effect peak achieved by about 2 or 3 weeks. Botulism toxin is expensive and you should be sure of what costs you will incur resulting from the injection.  The side effects of botulism toxin use for chronic migraine may include:   -Transient, and usually mild, facial weakness with facial injections  -Transient, and usually mild, head or neck weakness with head/neck  injections  -Reduction or loss of forehead facial animation due to forehead muscle weakness  -Eyelid drooping  -Dry eye  -Pain at the site of injection or bruising at the site of injection  -Double vision  -Potential unknown long term risks  Contraindications: You should not have Botox  if you are pregnant, nursing, allergic to albumin, have an infection, skin condition, or muscle weakness at the site of the injection, or have myasthenia gravis, Lambert-Eaton syndrome, or ALS.  It is also possible that as with any injection, there may be an allergic reaction or no effect from the medication. Reduced effectiveness after repeated injections is sometimes seen and rarely infection at the injection site may occur. All care will be taken to prevent these side effects. If therapy is given over a long time, atrophy and wasting in the muscle injected may occur. Occasionally the patient's become refractory to treatment because they develop antibodies to the toxin. In this event, therapy needs to be modified.  I have read the above information and consent to the administration of botulism toxin.    BOTOX  PROCEDURE NOTE FOR MIGRAINE HEADACHE    Contraindications and precautions discussed with patient(above). Aseptic procedure was observed and patient tolerated procedure. Procedure performed by Dr. Andree Epp  The condition has existed for more than 6 months, and pt does not have a diagnosis of ALS, Myasthenia Gravis or Lambert-Eaton Syndrome.  Risks and benefits of injections discussed and pt agrees to proceed with the procedure.  Written consent obtained  These injections are medically necessary. Pt  receives good benefits from these injections.  These injections do not cause sedations or hallucinations which the oral therapies may cause.  Description of procedure:  The patient was placed in a sitting position. The standard protocol was used for Botox  as follows, with 5 units of Botox  injected at each  site:   -Procerus muscle, midline injection  -Corrugator muscle, bilateral injection  -Frontalis muscle, bilateral injection, with 2 sites each side, medial injection was performed in the upper one third of the frontalis muscle, in the region vertical from the medial inferior edge of the superior orbital rim. The lateral injection was again in the upper one third of the forehead vertically above the lateral limbus of the cornea, 1.5 cm lateral to the medial injection site.  -Temporalis muscle injection, 4 sites, bilaterally. The first injection was 3 cm above the tragus of the ear, second injection site was 1.5 cm to 3 cm up from the first injection site in line with the tragus of the ear. The third injection site was 1.5-3 cm forward between the first 2 injection sites. The fourth injection site was 1.5 cm posterior to the second injection site.   -Occipitalis muscle injection, 3 sites, bilaterally. The first injection was done one half way between the occipital protuberance and the tip of the mastoid process behind the ear. The second injection site was done lateral and superior to the first, 1 fingerbreadth from the first injection. The third injection site was 1 fingerbreadth superiorly and medially from the first injection site.  -Cervical paraspinal muscle injection, 2 sites, bilateral knee first injection site was 1 cm from the midline of the cervical spine, 3 cm inferior to the lower border of the occipital protuberance. The second injection site was 1.5 cm superiorly and laterally to the first injection site.  -Trapezius muscle injection was performed at 3 sites, bilaterally. The first injection site was in the upper trapezius muscle halfway between the inflection point of the neck, and the acromion. The second injection site was one half way between the acromion and the first injection site. The third injection was done between the first injection site and the inflection point of the  neck.   Will return for repeat injection in 3 months.   200 units of Botox  was used, 45 U Botox  not injected was wasted. The patient tolerated the procedure well, there were no complications of the above procedure.

## 2023-03-14 ENCOUNTER — Other Ambulatory Visit: Payer: Self-pay | Admitting: Internal Medicine

## 2023-03-14 ENCOUNTER — Ambulatory Visit (INDEPENDENT_AMBULATORY_CARE_PROVIDER_SITE_OTHER): Payer: Medicare Other | Admitting: Internal Medicine

## 2023-03-14 VITALS — BP 110/72 | HR 80 | Temp 98.0°F | Ht 66.0 in | Wt 138.4 lb

## 2023-03-14 DIAGNOSIS — J45991 Cough variant asthma: Secondary | ICD-10-CM | POA: Diagnosis not present

## 2023-03-14 DIAGNOSIS — D440 Neoplasm of uncertain behavior of thyroid gland: Secondary | ICD-10-CM | POA: Diagnosis not present

## 2023-03-14 DIAGNOSIS — K635 Polyp of colon: Secondary | ICD-10-CM | POA: Diagnosis not present

## 2023-03-14 DIAGNOSIS — G43709 Chronic migraine without aura, not intractable, without status migrainosus: Secondary | ICD-10-CM

## 2023-03-14 DIAGNOSIS — M25541 Pain in joints of right hand: Secondary | ICD-10-CM

## 2023-03-14 DIAGNOSIS — R918 Other nonspecific abnormal finding of lung field: Secondary | ICD-10-CM

## 2023-03-14 DIAGNOSIS — M25542 Pain in joints of left hand: Secondary | ICD-10-CM

## 2023-03-14 DIAGNOSIS — Q278 Other specified congenital malformations of peripheral vascular system: Secondary | ICD-10-CM

## 2023-03-14 MED ORDER — ZAVZPRET 10 MG/ACT NA SOLN: 1.0000 | Freq: Once | NASAL | 3 refills | Status: AC | PRN

## 2023-03-14 MED ORDER — NURTEC 75 MG PO TBDP: ORAL_TABLET | ORAL | 3 refills | Status: AC

## 2023-03-14 MED ORDER — PREDNISONE 5 MG PO TABS: ORAL_TABLET | ORAL | 0 refills | Status: DC

## 2023-03-14 NOTE — Assessment & Plan Note (Signed)
 Dr Lucia Gaskins Chronic On Botx inj Continue with Fioricet as needed, Zomig as needed

## 2023-03-14 NOTE — Assessment & Plan Note (Signed)
 Dr [name] 2024 FNA: Benign follicular nodule (Bethesda category II)

## 2023-03-14 NOTE — Progress Notes (Signed)
 Subjective:  Patient ID: Ronal Jenkins JAYSON Creola, female    DOB: 04-17-1955  Age: 68 y.o. MRN: [id number]  CC: Medical Management of Chronic Issues (3 Month follow up regarding arthralgia. Notes pain within the joints has gotten a lot better outside of pain within the hands (patient will be reaching out to their hand surgeon))   HPI Hali Balgobin Melrose presents for hand arthritis, insomnia, thyroid  nodule.  Thyroid  nodule biopsy was benign. C/o abberant subclavian arteries found on the CT scan.  No symptoms Arthritis is much better on prednisone , however, became mildly symptomatic again on a very low-dose of prednisone   Outpatient Medications Prior to Visit  Medication Sig Dispense Refill   butalbital -acetaminophen -caffeine  (FIORICET) 50-325-40 MG tablet butalbital -acetaminophen -caffeine  50 mg-325 mg-40 mg tablet  Take 1 tablet every 4 hours by oral route. 14 tablet 5   CALCIUM PO Take by mouth.     Cholecalciferol (VITAMIN D3) 50 MCG (2000 UT) capsule Take 1 capsule (2,000 Units total) by mouth daily. 100 capsule 3   fluticasone  (FLONASE ) 50 MCG/ACT nasal spray Place 2 sprays into both nostrils daily. 48 mL 0   ipratropium (ATROVENT) 0.06 % nasal spray Place 2 sprays into both nostrils 4 (four) times daily as needed for rhinitis.     LUTEIN PO Take 1 capsule by mouth daily.     methocarbamol  (ROBAXIN ) 750 MG tablet Take 1 tablet (750 mg total) by mouth 4 (four) times daily. 60 tablet 6   pantoprazole (PROTONIX) 40 MG tablet Take 40 mg by mouth as needed.     traMADol  (ULTRAM ) 50 MG tablet TAKE 1 TABLET BY MOUTH EVERY 8 HOURS AS NEEDED. 90 tablet 3   zolmitriptan  (ZOMIG -ZMT) 5 MG disintegrating tablet Take 1 tablet (5 mg total) by mouth as needed for migraine. 30 tablet 4   zolpidem  (AMBIEN ) 10 MG tablet Take 0.5-1 tablets (5-10 mg total) by mouth at bedtime as needed for sleep. 30 tablet 3   predniSONE  (DELTASONE ) 5 MG tablet As dirrected 100 tablet 0   meloxicam  (MOBIC ) 7.5 MG tablet  Take 1-2 tablets (7.5-15 mg total) by mouth daily. 60 tablet 2   predniSONE  (DELTASONE ) 10 MG tablet Take 10 mg by mouth daily.     No facility-administered medications prior to visit.    ROS: Review of Systems  Constitutional:  Negative for activity change, appetite change, chills, fatigue and unexpected weight change.  HENT:  Negative for congestion, mouth sores and sinus pressure.   Eyes:  Negative for visual disturbance.  Respiratory:  Negative for cough and chest tightness.   Gastrointestinal:  Negative for abdominal pain and nausea.  Genitourinary:  Negative for difficulty urinating, frequency and vaginal pain.  Musculoskeletal:  Positive for arthralgias. Negative for back pain and gait problem.  Skin:  Negative for pallor and rash.  Neurological:  Negative for dizziness, tremors, weakness, numbness and headaches.  Psychiatric/Behavioral:  Negative for confusion, decreased concentration and sleep disturbance.     Objective:  BP 110/72   Pulse 80   Temp 98 F (36.7 C)   Ht 5' 6 (1.676 m)   Wt 138 lb 6.4 oz (62.8 kg)   SpO2 97%   BMI 22.34 kg/m   BP Readings from Last 3 Encounters:  03/14/23 110/72  12/10/22 134/72  11/21/22 122/70    Wt Readings from Last 3 Encounters:  03/14/23 138 lb 6.4 oz (62.8 kg)  01/03/23 140 lb (63.5 kg)  12/10/22 138 lb (62.6 kg)    Physical Exam Constitutional:  General: She is not in acute distress.    Appearance: Normal appearance. She is well-developed.  HENT:     Head: Normocephalic.     Right Ear: External ear normal.     Left Ear: External ear normal.     Nose: Nose normal.  Eyes:     General:        Right eye: No discharge.        Left eye: No discharge.     Conjunctiva/sclera: Conjunctivae normal.     Pupils: Pupils are equal, round, and reactive to light.  Neck:     Thyroid : No thyromegaly.     Vascular: No JVD.     Trachea: No tracheal deviation.  Cardiovascular:     Rate and Rhythm: Normal rate and regular  rhythm.     Heart sounds: Normal heart sounds.  Pulmonary:     Effort: No respiratory distress.     Breath sounds: No stridor. No wheezing.  Abdominal:     General: Bowel sounds are normal. There is no distension.     Palpations: Abdomen is soft. There is no mass.     Tenderness: There is no abdominal tenderness. There is no guarding or rebound.  Musculoskeletal:        General: No tenderness.     Cervical back: Normal range of motion and neck supple. No rigidity.  Lymphadenopathy:     Cervical: No cervical adenopathy.  Skin:    Findings: No erythema or rash.  Neurological:     Mental Status: She is oriented to person, place, and time.     Cranial Nerves: No cranial nerve deficit.     Motor: No abnormal muscle tone.     Coordination: Coordination normal.     Deep Tendon Reflexes: Reflexes normal.  Psychiatric:        Behavior: Behavior normal.        Thought Content: Thought content normal.        Judgment: Judgment normal.   Hand joints without pain.  Trigger finger #5 on the right  Lab Results  Component Value Date   WBC 6.3 12/07/2022   HGB 12.8 12/07/2022   HCT 38.5 12/07/2022   PLT 193.0 12/07/2022   GLUCOSE 87 12/07/2022   ALT 15 12/07/2022   AST 20 12/07/2022   NA 138 12/07/2022   K 3.9 12/07/2022   CL 101 12/07/2022   CREATININE 0.61 12/07/2022   BUN 15 12/07/2022   CO2 30 12/07/2022   TSH 0.64 12/07/2022    US  FNA BX THYROID  1ST LESION AFIRMA Result Date: 01/25/2023 INDICATION: Indeterminate thyroid  nodule EXAM: ULTRASOUND GUIDED FINE NEEDLE ASPIRATION OF INDETERMINATE THYROID  NODULE COMPARISON:  Thyroid  ultrasound 12/28/2022 MEDICATIONS: None COMPLICATIONS: None immediate. TECHNIQUE: Informed written consent was obtained from the patient after a discussion of the risks, benefits and alternatives to treatment. Questions regarding the procedure were encouraged and answered. A timeout was performed prior to the initiation of the procedure. Pre-procedural  ultrasound scanning demonstrated unchanged size and appearance of the indeterminate nodule within the right gland The procedure was planned. The neck was prepped in the usual sterile fashion, and a sterile drape was applied covering the operative field. A timeout was performed prior to the initiation of the procedure. Local anesthesia was provided with 1% lidocaine . Under direct ultrasound guidance, 5 FNA biopsies were performed of the nodule with a 25 gauge needle. Two samples were reserved for future Afirma testing. Multiple ultrasound images were saved for procedural documentation purposes. The  samples were prepared and submitted to pathology. Limited post procedural scanning was negative for hematoma or additional complication. Dressings were placed. The patient tolerated the above procedures procedure well without immediate postprocedural complication. FINDINGS: Nodule reference number based on prior diagnostic ultrasound: 1 Maximum size: 2.6 cm Location: Right; Superior ACR TI-RADS risk category: TR3 (3 points) Reason for biopsy: meets ACR TI-RADS criteria Ultrasound imaging confirms appropriate placement of the needles within the thyroid  nodule. IMPRESSION: Technically successful ultrasound guided fine needle aspiration of the TI-RADS category 3 nodule in the right gland. Electronically Signed   By: Wilkie Lent M.D.   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  Will taper off over a period of time.  Will maintain on the lowest dose that does the job, hopefully under 5 mg a day. She will take vitamin D3+K2  Potential benefits of a long term steroid  use as well as potential risks  and complications were explained to the patient and were aknowledged. On a PPI - Protonix  Potential benefits of a long term PPI use as well as potential risks  and complications were explained to the patient and were aknowledged.      Aberrant subclavian artery   2024 incidental finding on chest CT.  Asymptomatic         Meds ordered this encounter  Medications   predniSONE  (DELTASONE ) 5 MG tablet    Sig: As directed 1-4 mg po pc    Dispense:  100 tablet    Refill:  0   Rimegepant Sulfate (NURTEC) 75 MG TBDP    Sig: 1 po qod prn    Dispense:  12 tablet    Refill:  3   Zavegepant HCl (ZAVZPRET ) 10 MG/ACT SOLN    Sig: Place 1 spray into the nose once as needed for up to 1 dose.    Dispense:  6 each    Refill:  3      Follow-up: Return in about 3 months (around 06/12/2023) for a follow-up visit.  Marolyn Noel, MD

## 2023-03-16 IMAGING — DX DG CHEST 2V
2 series · 2 of 2 positions shown · non-contrast
Comparison: Cardiac CT July 2020

CLINICAL DATA: Dx cough x 1 month

EXAM:
CHEST - 2 VIEW

[chest pa]
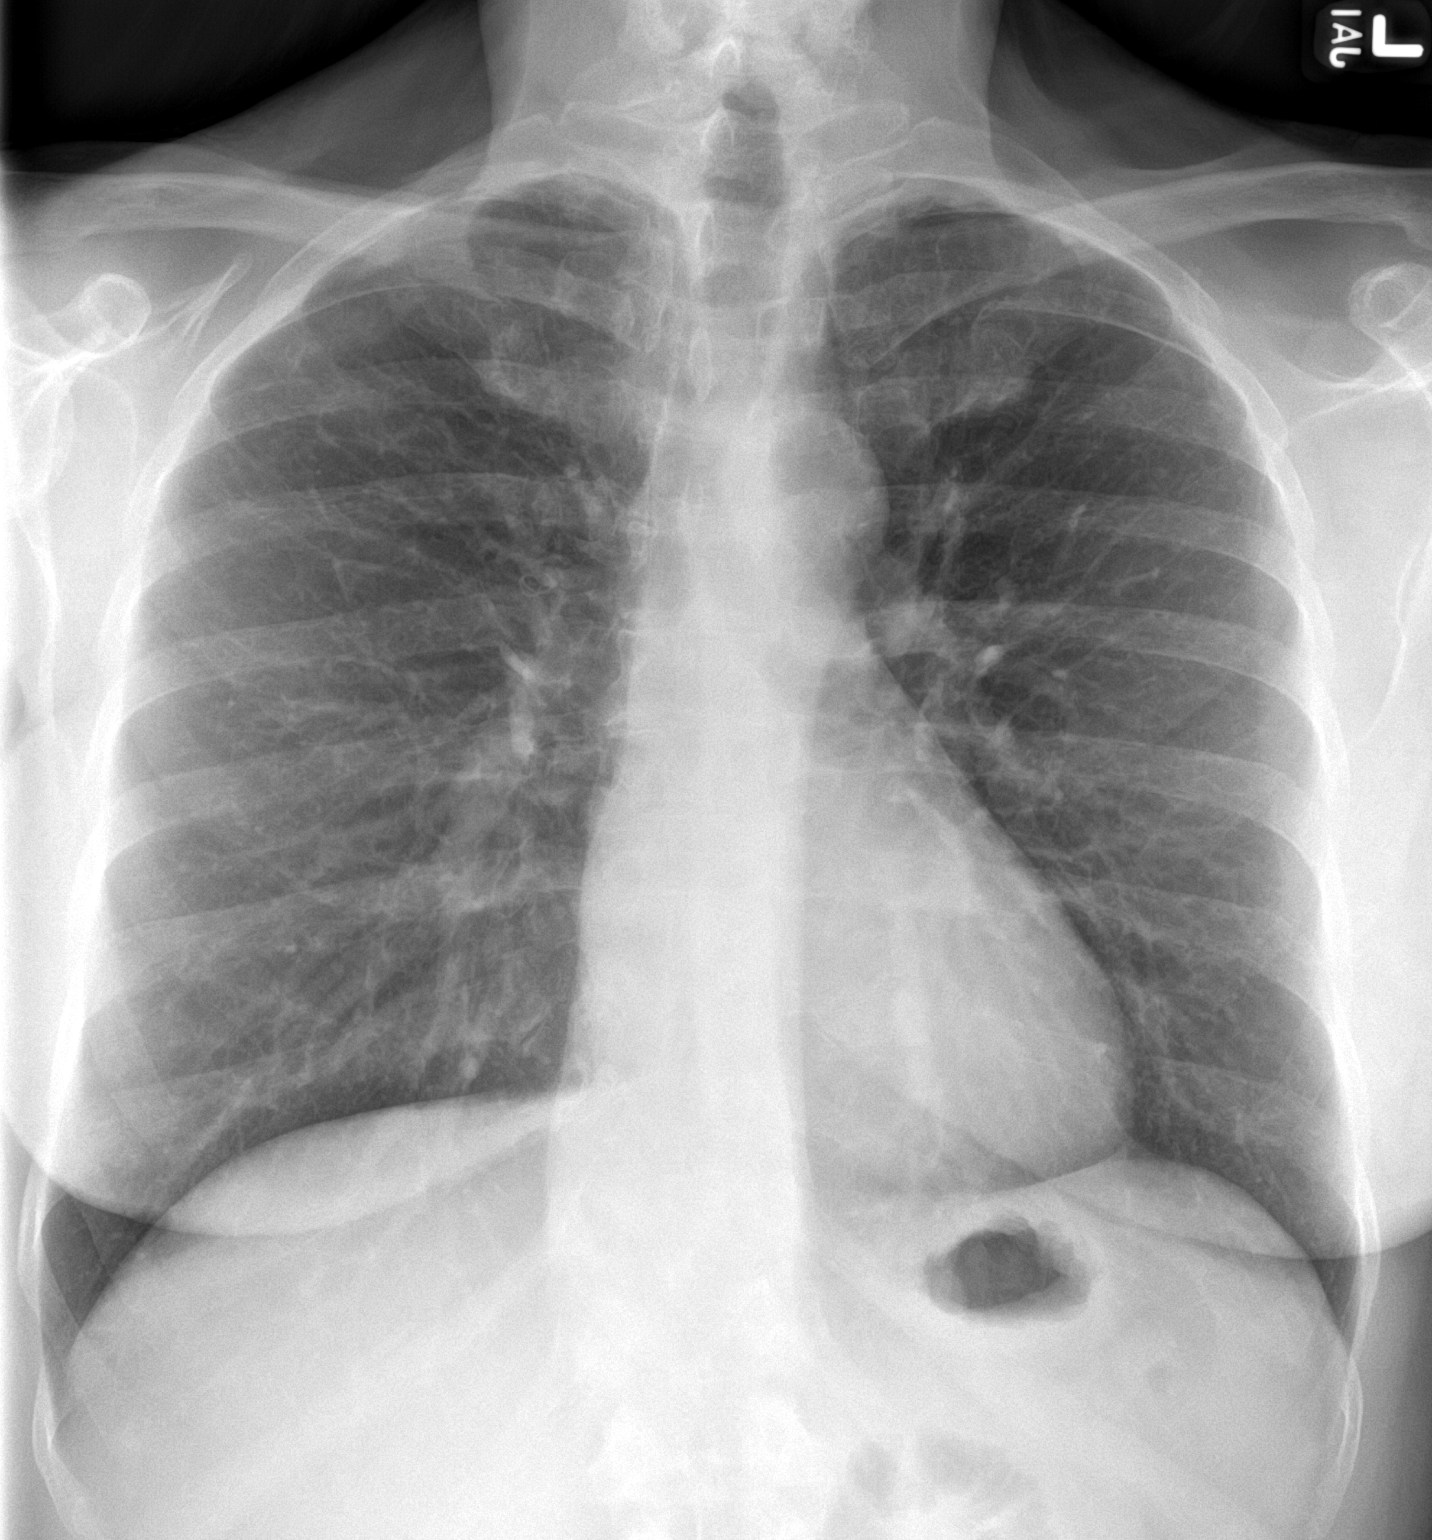

[chest lat]
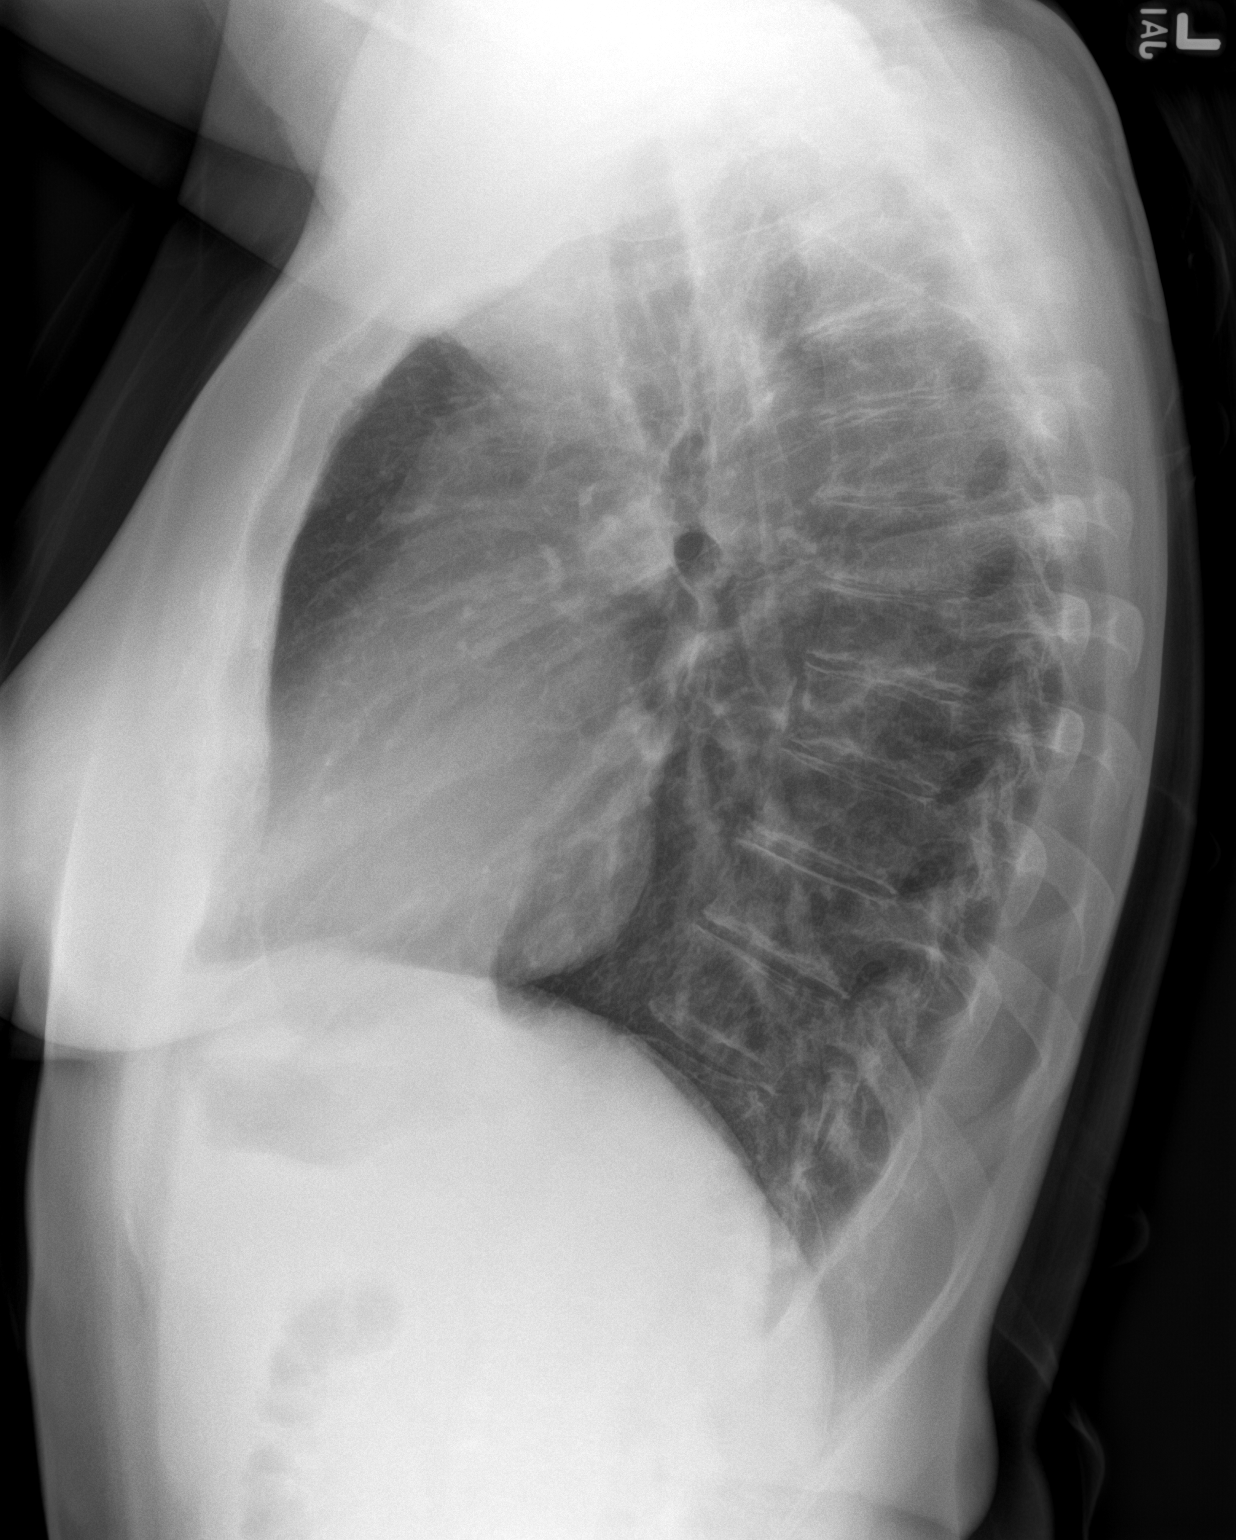

[2 of 2 positions shown; findings below may reference images not displayed]

FINDINGS: The cardiomediastinal silhouette is within normal limits. No pleural
effusion. No pneumothorax. No mass or consolidation. No acute
osseous abnormality.
IMPRESSION: No acute findings in the chest.

## 2023-03-18 ENCOUNTER — Encounter: Payer: Self-pay | Admitting: Internal Medicine

## 2023-03-18 DIAGNOSIS — Q278 Other specified congenital malformations of peripheral vascular system: Secondary | ICD-10-CM | POA: Insufficient documentation

## 2023-03-18 NOTE — Assessment & Plan Note (Signed)
 Follow-up colonoscopy with Dr Loreta Ave

## 2023-03-18 NOTE — Assessment & Plan Note (Signed)
 Resolved

## 2023-03-18 NOTE — Assessment & Plan Note (Signed)
 Symptoms resolved

## 2023-03-18 NOTE — Assessment & Plan Note (Signed)
 Much better on steroids, using with caution.  Will taper off over a period of time.  Will maintain on the lowest dose that does the job, hopefully under 5 mg a day. She will take vitamin D3+K2  Potential benefits of a long term steroid  use as well as potential risks  and complications were explained to the patient and were aknowledged. On a PPI - Protonix  Potential benefits of a long term PPI use as well as potential risks  and complications were explained to the patient and were aknowledged.

## 2023-03-18 NOTE — Assessment & Plan Note (Signed)
 2024 incidental finding on chest CT.  Asymptomatic

## 2023-04-16 ENCOUNTER — Telehealth (INDEPENDENT_AMBULATORY_CARE_PROVIDER_SITE_OTHER): Payer: Self-pay | Admitting: Otolaryngology

## 2023-04-16 NOTE — Telephone Encounter (Signed)
Flonase nasal spray was called in at [facility] [address] with 5 refills.

## 2023-04-18 ENCOUNTER — Other Ambulatory Visit: Payer: Self-pay | Admitting: Internal Medicine

## 2023-04-19 ENCOUNTER — Other Ambulatory Visit: Payer: Self-pay | Admitting: Internal Medicine

## 2023-04-26 ENCOUNTER — Encounter: Payer: Self-pay | Admitting: Internal Medicine

## 2023-04-26 ENCOUNTER — Ambulatory Visit: Payer: Medicare Other | Admitting: Internal Medicine

## 2023-04-26 VITALS — BP 126/68 | HR 97 | Temp 98.9°F | Ht 66.0 in | Wt 127.4 lb

## 2023-04-26 DIAGNOSIS — J45991 Cough variant asthma: Secondary | ICD-10-CM

## 2023-04-26 MED ORDER — PREDNISONE 10 MG PO TABS: ORAL_TABLET | ORAL | 0 refills | Status: DC

## 2023-04-26 MED ORDER — ACETAMINOPHEN-CODEINE 300-30 MG PO TABS: 1.0000 | ORAL_TABLET | ORAL | 0 refills | Status: AC | PRN

## 2023-04-26 NOTE — Progress Notes (Signed)
 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 Cough variant asthma   Final  pulmonary eval 2014 Dr Delford Field: Cough variant asthma Cough variant asthma now improved Plan Reduce Qvar in one week to two puff daily, then after two more weeks can stop Qvar Use benzonatate as needed Protonix daily Stay on flonase through end of spring Return as needed         History of Present Illness  05/21/2022  Pulmonary/ 1st office eval/Whitney [name] / referred back to pulmonary for recurrent cough since end of 2023 rx Trelegy> d/c /still on singulair and a/p short course pred plus narcotic cough meds/tramadol > still coughing  Chief Complaint  Patient presents with   Consult    Cough x 3 months.  Slight SOB at times.  Teoh eval p 4 weeks and rx augmentin Dyspnea:  no doe  Cough: scant purulent sporadic ? A bit worse at hs p tramadol  Sleep: no problem while sleeping  SABA use: none  Rec GERD diet  Pantoprazole (protonix) 40 mg   Take  30-60 min before first meal of the day and Pepcid (famotidine)  20 mg after supper until return to office - this is the best way to tell whether stomach acid is contributing to your problem.  Prednisone 10 mg take  4 each am x 2 days,   2 each am x 2 days,  1 each am x 2 days and stop   Consider seeing Dr [name] if sinus problem persists  Take delsym two tsp every 12 hours and supplement if needed with  tramadol 50 mg up to 2 every 4 hours  We will be in touch for Chest Ct in Sept 2024   Teoph eval rec :   nasal spray and gabapentin 300 mg but did not start    08/03/2022  f/u ov/Whitney [name] re: cough maint on PPI qam ac and pepcid 20 mg after supper  - has gabapentin 300 mg not using  Chief Complaint  Patient presents with   Follow-up    Occasional cough.  Discuss medication regimen.   Dyspnea:  none Cough: still occ on deep  breath  and at hs / dry  Sleeping: ok once asleep Rec For drainage / throat tickle try take CHLORPHENIRAMINE  4 mg    Ok with to use protonix  40 and pepcid 20 mg as needed for slightest flare of cough for any reason.    04/26/2023  Acute ov/Whitney [name] re: acute cough x 2 weeks  p  "sinus infection" cleared up with amox but not the assoc dry cough/ maint on ppi/added pepcid  delsym no better  Chief Complaint  Patient presents with   Acute Visit    Cough x 2 weeks.  Has has rxn to Tramadol.  Dyspnea:  none still works out  Cough: dry s/p rx amox for a week  Sleeping: about 30 degrees since the cough seems to help  resp cc  SABA use: none  02: none    No obvious day to day or daytime variability or assoc excess/ purulent sputum or mucus plugs or hemoptysis or cp or chest tightness, subjective wheeze or overt sinus or hb symptoms.    Also denies any obvious fluctuation of symptoms with weather or environmental changes or other aggravating or alleviating factors except as  outlined above   No unusual exposure hx or h/o childhood pna/ asthma or knowledge of premature birth.  Current Allergies, Complete Past Medical History, Past Surgical History, Family History, and Social History were reviewed in [employer] record.  ROS  The following are not active complaints unless bolded Hoarseness, sore throat, dysphagia, dental problems, itching, sneezing,  nasal congestion or discharge of excess mucus or purulent secretions, ear ache,   fever, chills, sweats, unintended wt loss or wt gain, classically pleuritic or exertional cp,  orthopnea pnd or arm/hand swelling  or leg swelling, presyncope, palpitations, abdominal pain, anorexia, nausea, vomiting, diarrhea  or change in bowel habits or change in bladder habits, change in stools or change in urine, dysuria, hematuria,  rash, arthralgias, visual complaints, headache, numbness, weakness or ataxia or problems with walking or coordination,   change in mood or  memory.        Current Meds  Medication Sig   butalbital-acetaminophen-caffeine (FIORICET) 50-325-40 MG tablet butalbital-acetaminophen-caffeine 50 mg-325 mg-40 mg tablet  Take 1 tablet every 4 hours by oral route.   CALCIUM PO Take by mouth.   Cholecalciferol (VITAMIN D3) 50 MCG (2000 UT) capsule Take 1 capsule (2,000 Units total) by mouth daily.   ipratropium (ATROVENT) 0.06 % nasal spray Place 2 sprays into both nostrils 4 (four) times daily as needed for rhinitis.   LUTEIN PO Take 1 capsule by mouth daily.   methocarbamol (ROBAXIN) 750 MG tablet Take 1 tablet (750 mg total) by mouth 4 (four) times daily.   pantoprazole (PROTONIX) 40 MG tablet Take 40 mg by mouth as needed.   Rimegepant Sulfate (NURTEC) 75 MG TBDP 1 po qod prn   Zavegepant HCl (ZAVZPRET) 10 MG/ACT SOLN Place 1 spray into the nose once as needed for up to 1 dose.   zolmitriptan (ZOMIG-ZMT) 5 MG disintegrating tablet Take 1 tablet (5 mg total) by mouth as needed for migraine.   zolpidem (AMBIEN) 10 MG tablet Take 0.5-1 tablets (5-10 mg total) by mouth at bedtime as needed for sleep.             Objective:    Wts  04/26/2023       127    08/03/22 152 lb 12.8 oz (69.3 kg)  05/21/22 155 lb 6.4 oz (70.5 kg)  03/20/22 150 lb (68 kg)       Vital signs reviewed  04/26/2023  - Note at rest 02 sats  99% on RA   General appearance:    amb wf nad / slt hoarse with spont dry coughing fits    HEENT : Oropharynx  clear      Nasal turbinates nl    NECK :  without  apparent JVD/ palpable Nodes/TM    LUNGS: no acc muscle use,  Nl contour chest which is clear to A and P bilaterally without reproducible cough on insp or exp maneuvers   CV:  RRR  no s3 or murmur or increase in P2, and no edema   ABD:  soft and nontender   MS:  Gait nl   ext warm without deformities Or obvious joint restrictions  calf tenderness, cyanosis or clubbing    SKIN: warm and dry without lesions    NEURO:  alert, approp,  nl sensorium with  no motor or cerebellar deficits apparent.        I personally reviewed images and agree with radiology impression as follows:  Sinus CT 04/22/22     Sinuses/Orbits: Paranasal sinuses  and mastoid air cells are clear      Assessment

## 2023-04-26 NOTE — Patient Instructions (Signed)
 Prednisone 10 mg take  4 each am x 2 days,   2 each am x 2 days,  1 each am x 2 days and stop   Take delsym two tsp every 12 hours and supplement if needed with  Tylenol #3   up to 1  every 4 hours to suppress the urge to cough. Swallowing water and/or using ice chips/non mint and menthol containing candies (such as lifesavers or sugarless jolly ranchers) are also effective.  You should rest your voice and avoid activities that you know make you cough.  Once you have eliminated the cough for 3 straight days try reducing the Tylenol #3 first,  then the delsym as tolerated.     Continue Pantoprazole (protonix) 40 mg   Take  30-60 min before first meal of the day and Pepcid (famotidine)  20 mg after supper until no cough for a week then ok to stop

## 2023-04-26 NOTE — Assessment & Plan Note (Signed)
 Recurrent cough p uri vs sinusitis already rx amox as of  04/26/2023 / maint on ppi q am   Rec  Prednisone 10 mg take  4 each am x 2 days,   2 each am x 2 days,  1 each am x 2 days and stop   Take delsym two tsp every 12 hours and supplement if needed with  Tylenol #3   up to 1  every 4 hours to suppress the urge to cough. Swallowing water and/or using ice chips/non mint and menthol containing candies (such as lifesavers or sugarless jolly ranchers) are also effective.  You should rest your voice and avoid activities that you know make you cough.  Once you have eliminated the cough for 3 straight days try reducing the Tylenol #3 first,  then the delsym as tolerated.     Continue Pantoprazole (protonix) 40 mg   Take  30-60 min before first meal of the day and Pepcid (famotidine)  20 mg after supper until no cough for a week then ok to stop the pepcid   If not better next step is cxr/ advised (strongly doubt aspiration mech but developed cough p lower gum surgery.           Each maintenance medication was reviewed in detail including emphasizing most importantly the difference between maintenance and prns and under what circumstances the prns are to be triggered using an action plan format where appropriate.  Total time for H and P, chart review, counseling,   and generating customized AVS unique to this office visit / same day charting = 32 min

## 2023-05-04 ENCOUNTER — Encounter: Payer: Self-pay | Admitting: Internal Medicine

## 2023-05-06 NOTE — Telephone Encounter (Signed)
 Patient states they have followed all directions from last visit and the cough is still there. Last thing I see is you suggested a chest xray. Would you like them to have the chest xray now? She was asking if you thought refill of tylenol 3. Please advise.

## 2023-05-07 ENCOUNTER — Ambulatory Visit (INDEPENDENT_AMBULATORY_CARE_PROVIDER_SITE_OTHER): Admitting: Internal Medicine

## 2023-05-07 ENCOUNTER — Encounter: Payer: Self-pay | Admitting: Internal Medicine

## 2023-05-07 ENCOUNTER — Ambulatory Visit

## 2023-05-07 VITALS — BP 150/90 | HR 88 | Ht 66.0 in | Wt 133.4 lb

## 2023-05-07 DIAGNOSIS — J45991 Cough variant asthma: Secondary | ICD-10-CM

## 2023-05-07 MED ORDER — PREDNISONE 10 MG PO TABS: ORAL_TABLET | ORAL | 0 refills | Status: DC

## 2023-05-07 MED ORDER — ACETAMINOPHEN-CODEINE 300-30 MG PO TABS: 1.0000 | ORAL_TABLET | ORAL | 0 refills | Status: AC | PRN

## 2023-05-07 NOTE — Patient Instructions (Signed)
 Prednisone 10 mg take  4 each am x 2 days,   2 each am x 2 days,  1 each am x 2 days and stop   Take delsym two tsp every 12 hours and supplement if needed with  Tylenol #3   up to 1  every 4 hours to suppress the urge to cough. Swallowing water and/or using ice chips/non mint and menthol containing candies (such as lifesavers or sugarless jolly ranchers) are also effective.  You should rest your voice and avoid activities that you know make you cough.  Once you have eliminated the cough for 3 straight days try reducing the Tylenol #3 first,  then the delsym as tolerated.     Continue Pantoprazole (protonix) 40 mg   Take  30-60 min before first meal of the day and Pepcid (famotidine)  20 mg after supper until no cough for a week then ok to stop  For drainage / throat tickle try take CHLORPHENIRAMINE  4 mg  ("Allergy Relief" 4mg   at [facility] should be easiest to find in the blue box usually on bottom shelf)  take one every 4 hours as needed - extremely effective and inexpensive over the counter- may cause drowsiness so start with just a dose or two an hour before bedtime and see how you tolerate it before trying in daytime.   Please remember to go to the  x-ray department  for your tests - we will call you with the results when they are available

## 2023-05-07 NOTE — Progress Notes (Unsigned)
 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 Cough variant asthma   Final  pulmonary eval 2014 Dr Delford Field: Cough variant asthma Cough variant asthma now improved Plan Reduce Qvar in one week to two puff daily, then after two more weeks can stop Qvar Use benzonatate as needed Protonix daily Stay on flonase through end of spring Return as needed         History of Present Illness  05/21/2022  Pulmonary/ 1st office eval/[name] / referred back to pulmonary for recurrent cough since end of 2023 rx Trelegy> d/c /still on singulair and a/p short course pred plus narcotic cough meds/tramadol > still coughing  Chief Complaint  Patient presents with   Consult    Cough x 3 months.  Slight SOB at times.  Whitney eval p 4 weeks and rx augmentin Dyspnea:  no doe  Cough: scant purulent sporadic ? A bit worse at hs p tramadol  Sleep: no problem while sleeping  SABA use: none  Rec GERD diet  Pantoprazole (protonix) 40 mg   Take  30-60 min before first meal of the day and Pepcid (famotidine)  20 mg after supper until return to office - this is the best way to tell whether stomach acid is contributing to your problem.  Prednisone 10 mg take  4 each am x 2 days,   2 each am x 2 days,  1 each am x 2 days and stop   Consider seeing Dr [name] if sinus problem persists  Take delsym two tsp every 12 hours and supplement if needed with  tramadol 50 mg up to 2 every 4 hours  We will be in touch for Chest Ct in Sept 2024   Teoph eval rec :   nasal spray and gabapentin 300 mg but did not start    08/03/2022  f/u ov/Whitney [name] re: cough maint on PPI qam ac and pepcid 20 mg after supper  - has gabapentin 300 mg not using  Chief Complaint  Patient presents with   Follow-up    Occasional cough.  Discuss medication regimen.   Dyspnea:  none Cough: still occ on deep  breath  and at hs / dry  Sleeping: ok once asleep Rec For drainage / throat tickle try take CHLORPHENIRAMINE  4 mg    Ok with to use protonix  40 and pepcid 20 mg as needed for slightest flare of cough for any reason.    04/26/2023  Acute ov/Whitney [name] re: acute cough x 2 weeks  p  "sinus infection" cleared up with amox but not the assoc dry cough/ maint on ppi/added pepcid  delsym no better  Chief Complaint  Patient presents with   Acute Visit    Cough x 2 weeks.  Has has rxn to Tramadol.  Dyspnea:  none still works out  Cough: dry s/p rx amox for a week  Sleeping: about 30 degrees since the cough seems to help  resp cc  SABA use: none  02: none  Rec Prednisone 10 mg take  4 each am x 2 days,   2 each am x 2 days,  1 each am x 2 days and stop  Take delsym two tsp every 12 hours and supplement if needed with  Tylenol #3   up to 1  every 4 hours . Once  you have eliminated the cough for 3 straight days try reducing the Tylenol #3 first,  then the delsym as tolerated.    Continue Pantoprazole (protonix) 40 mg   Take  30-60 min before first meal of the day and Pepcid (famotidine)  20 mg after supper until no cough for a week then ok to stop    05/07/2023  f/u ov/Whitney [name] re: cough  x early Feb 2025    maint on gerd rx  Dyspnea:  none  Cough: 75% better until ran out of Tylenol #3  Sleeping: fine p Tylenol #3 s resp cc  SABA use: none  02: non      No obvious day to day or daytime variability or assoc excess/ purulent sputum or mucus plugs or hemoptysis or cp or chest tightness, subjective wheeze or overt sinus or hb symptoms.    Also denies any obvious fluctuation of symptoms with weather or environmental changes or other aggravating or alleviating factors except as outlined above   No unusual exposure hx or h/o childhood pna/ asthma or knowledge of premature birth.  Current Allergies, Complete Past Medical History, Past Surgical History, Family History, and Social History were reviewed in  [employer] record.  ROS  The following are not active complaints unless bolded Hoarseness, sore throat, dysphagia, dental problems, itching, sneezing,  nasal congestion or discharge of excess mucus or purulent secretions, ear ache,   fever, chills, sweats, unintended wt loss or wt gain, classically pleuritic or exertional cp,  orthopnea pnd or arm/hand swelling  or leg swelling, presyncope, palpitations, abdominal pain, anorexia, nausea, vomiting, diarrhea  or change in bowel habits or change in bladder habits, change in stools or change in urine, dysuria, hematuria,  rash, arthralgias, visual complaints, headache, numbness, weakness or ataxia or problems with walking or coordination,  change in mood or  memory.        Current Meds  Medication Sig   acetaminophen-codeine (TYLENOL #3) 300-30 MG tablet Take 1 tablet by mouth every 4 (four) hours as needed for up to 5 days.   butalbital-acetaminophen-caffeine (FIORICET) 50-325-40 MG tablet butalbital-acetaminophen-caffeine 50 mg-325 mg-40 mg tablet  Take 1 tablet every 4 hours by oral route.   CALCIUM PO Take by mouth.   Cholecalciferol (VITAMIN D3) 50 MCG (2000 UT) capsule Take 1 capsule (2,000 Units total) by mouth daily.   ipratropium (ATROVENT) 0.06 % nasal spray Place 2 sprays into both nostrils 4 (four) times daily as needed for rhinitis.   LUTEIN PO Take 1 capsule by mouth daily.   methocarbamol (ROBAXIN) 750 MG tablet Take 1 tablet (750 mg total) by mouth 4 (four) times daily.   pantoprazole (PROTONIX) 40 MG tablet Take 40 mg by mouth as needed.   predniSONE (DELTASONE) 10 MG tablet Take  4 each am x 2 days,   2 each am x 2 days,  1 each am x 2 days and stop   Rimegepant Sulfate (NURTEC) 75 MG TBDP 1 po qod prn   Zavegepant HCl (ZAVZPRET) 10 MG/ACT SOLN Place 1 spray into the nose once as needed for up to 1 dose.   zolmitriptan (ZOMIG-ZMT) 5 MG disintegrating tablet Take 1 tablet (5 mg total) by mouth as needed for  migraine.   zolpidem (AMBIEN) 10 MG tablet Take 0.5-1 tablets (5-10 mg total) by mouth at bedtime as needed for sleep.              Objective:    Wts  05/07/2023  133    04/26/2023       127    08/03/22 152 lb 12.8 oz (69.3 kg)  05/21/22 155 lb 6.4 oz (70.5 kg)  03/20/22 150 lb (68 kg)    Vital signs reviewed  05/07/2023  - Note at rest 02 sats  94% on RA   General appearance:    amb wf nad   HEENT : Oropharynx  clear      Nasal turbinates nl    NECK :  without  apparent JVD/ palpable Nodes/TM    LUNGS: no acc muscle use,  Nl contour chest which is clear to A and P bilaterally without cough on insp or exp maneuvers   CV:  RRR  no s3 or murmur or increase in P2, and no edema   ABD:  soft and nontender   MS:    ext warm without deformities Or obvious joint restrictions  calf tenderness, cyanosis or clubbing    SKIN: warm and dry without lesions    NEURO:  alert, approp, nl sensorium with  no motor or cerebellar deficits apparent.          I personally reviewed images and agree with radiology impression as follows:  Sinus CT 04/22/22     Sinuses/Orbits: Paranasal sinuses and mastoid air cells are clear      CXR PA and Lateral:   05/07/2023 :    I personally reviewed images and impression is as follows:     Mild kyphosis/clear lungs    Assessment

## 2023-05-08 ENCOUNTER — Other Ambulatory Visit: Payer: Self-pay

## 2023-05-08 DIAGNOSIS — J45991 Cough variant asthma: Secondary | ICD-10-CM

## 2023-05-08 NOTE — Assessment & Plan Note (Signed)
 Strongly favor uacs over cough variant asthma  Of the three most common causes of  Sub-acute / recurrent or chronic cough, only one (GERD)  can actually contribute to/ trigger  the other two (asthma and post nasal drip syndrome)  and perpetuate the cylce of cough.  While not intuitively obvious, many patients with chronic low grade reflux do not cough until there is a primary insult that disturbs the protective epithelial barrier and exposes sensitive nerve endings.   This is typically viral but can due to PNDS and  either may apply here.   The point is that once this occurs, it is difficult to eliminate the cycle  using anything but a maximally effective acid suppression regimen at least in the short run, accompanied by an appropriate diet to address non acid GERD and control / eliminate the cough itself for at least 3 days with tyl #3 and use of hard rock candy and if not effective next step is gabapentin.  >>> also added 6 day taper off  Prednisone starting at 40 mg per day in case of component of Th-2 driven upper or lower airways inflammation (if cough responds short term only to relapse before return while will on full rx for uacs (as above), then that would point to allergic rhinitis/ asthma or eos bronchitis as alternative dx)           Each maintenance medication was reviewed in detail including emphasizing most importantly the difference between maintenance and prns and under what circumstances the prns are to be triggered using an action plan format where appropriate.  Total time for H and P, chart review, counseling,  and generating customized AVS unique to this office visit / same day charting  = 23  min for    refractory respiratory  symptoms of uncertain etiology

## 2023-05-27 ENCOUNTER — Encounter: Payer: Self-pay | Admitting: *Deleted

## 2023-06-03 ENCOUNTER — Ambulatory Visit (INDEPENDENT_AMBULATORY_CARE_PROVIDER_SITE_OTHER): Payer: Medicare Other | Admitting: Neurology

## 2023-06-03 DIAGNOSIS — G43709 Chronic migraine without aura, not intractable, without status migrainosus: Secondary | ICD-10-CM | POA: Diagnosis not present

## 2023-06-03 MED ORDER — ONABOTULINUMTOXINA 200 UNITS IJ SOLR
155.0000 [IU] | Freq: Once | INTRAMUSCULAR | Status: AC
  Administered 2023-06-03: 155 [IU] via INTRAMUSCULAR

## 2023-06-03 NOTE — Progress Notes (Unsigned)
 Botox- 200 units x 1 vial Lot: [id number] Expiration: 06/2025 NDC: 2952-8413-24  Bacteriostatic 0.9% Sodium Chloride- * mL  Lot: [id number] Expiration: 01/04/2024 NDC: 2536-6440-34  Dx: V42.595 B/B Witnessed by Leeann Must, RN

## 2023-06-03 NOTE — Progress Notes (Unsigned)
 Consent Form Botulism Toxin Injection For Chronic Migraine  06/03/2023; Stable 03/11/2023: >>>80% decrease in freg and severity of migraines and headaches since being on botox. Stable.  12/17/2022: >>>80% decrease in freg and severity of migraines and headaches since being on botox. Stable.  09/24/2022: doing great 06/26/2022: Stable, still doing great.  04/02/2022: Transfer of care from Dr. Antonietta Jewel.   Doing great on botox. Only 4 migraines a month >>>80% decrease in freg and severity of migraines and headaches since being on botox. Gave her samples of ubrelvy and nurtec to try as well, explained nurtec can be used acutely and preventatively or in the last 1-2 weeks when botox is wearing off to avoid breakthrough. Zomig works well acutely  No orders of the defined types were placed in this encounter.      Reviewed orally with patient, additionally signature is on file:  Botulism toxin has been approved by the Federal drug administration for treatment of chronic migraine. Botulism toxin does not cure chronic migraine and it may not be effective in some patients.  The administration of botulism toxin is accomplished by injecting a small amount of toxin into the muscles of the neck and head. Dosage must be titrated for each individual. Any benefits resulting from botulism toxin tend to wear off after 3 months with a repeat injection required if benefit is to be maintained. Injections are usually done every 3-4 months with maximum effect peak achieved by about 2 or 3 weeks. Botulism toxin is expensive and you should be sure of what costs you will incur resulting from the injection.  The side effects of botulism toxin use for chronic migraine may include:   -Transient, and usually mild, facial weakness with facial injections  -Transient, and usually mild, head or neck weakness with head/neck injections  -Reduction or loss of forehead facial animation due to forehead muscle weakness  -Eyelid  drooping  -Dry eye  -Pain at the site of injection or bruising at the site of injection  -Double vision  -Potential unknown long term risks  Contraindications: You should not have Botox if you are pregnant, nursing, allergic to albumin, have an infection, skin condition, or muscle weakness at the site of the injection, or have myasthenia gravis, Lambert-Eaton syndrome, or ALS.  It is also possible that as with any injection, there may be an allergic reaction or no effect from the medication. Reduced effectiveness after repeated injections is sometimes seen and rarely infection at the injection site may occur. All care will be taken to prevent these side effects. If therapy is given over a long time, atrophy and wasting in the muscle injected may occur. Occasionally the patient's become refractory to treatment because they develop antibodies to the toxin. In this event, therapy needs to be modified.  I have read the above information and consent to the administration of botulism toxin.    BOTOX PROCEDURE NOTE FOR MIGRAINE HEADACHE    Contraindications and precautions discussed with patient(above). Aseptic procedure was observed and patient tolerated procedure. Procedure performed by Dr. Artemio Aly  The condition has existed for more than 6 months, and pt does not have a diagnosis of ALS, Myasthenia Gravis or Lambert-Eaton Syndrome.  Risks and benefits of injections discussed and pt agrees to proceed with the procedure.  Written consent obtained  These injections are medically necessary. Pt  receives good benefits from these injections. These injections do not cause sedations or hallucinations which the oral therapies may cause.  Description of procedure:  The  patient was placed in a sitting position. The standard protocol was used for Botox as follows, with 5 units of Botox injected at each site:   -Procerus muscle, midline injection  -Corrugator muscle, bilateral  injection  -Frontalis muscle, bilateral injection, with 2 sites each side, medial injection was performed in the upper one third of the frontalis muscle, in the region vertical from the medial inferior edge of the superior orbital rim. The lateral injection was again in the upper one third of the forehead vertically above the lateral limbus of the cornea, 1.5 cm lateral to the medial injection site.  -Temporalis muscle injection, 4 sites, bilaterally. The first injection was 3 cm above the tragus of the ear, second injection site was 1.5 cm to 3 cm up from the first injection site in line with the tragus of the ear. The third injection site was 1.5-3 cm forward between the first 2 injection sites. The fourth injection site was 1.5 cm posterior to the second injection site.   -Occipitalis muscle injection, 3 sites, bilaterally. The first injection was done one half way between the occipital protuberance and the tip of the mastoid process behind the ear. The second injection site was done lateral and superior to the first, 1 fingerbreadth from the first injection. The third injection site was 1 fingerbreadth superiorly and medially from the first injection site.  -Cervical paraspinal muscle injection, 2 sites, bilateral knee first injection site was 1 cm from the midline of the cervical spine, 3 cm inferior to the lower border of the occipital protuberance. The second injection site was 1.5 cm superiorly and laterally to the first injection site.  -Trapezius muscle injection was performed at 3 sites, bilaterally. The first injection site was in the upper trapezius muscle halfway between the inflection point of the neck, and the acromion. The second injection site was one half way between the acromion and the first injection site. The third injection was done between the first injection site and the inflection point of the neck.   Will return for repeat injection in 3 months.   200 units of Botox was  used, 45 U Botox not injected was wasted. The patient tolerated the procedure well, there were no complications of the above procedure.

## 2023-06-06 ENCOUNTER — Ambulatory Visit
Admission: RE | Admit: 2023-06-06 | Discharge: 2023-06-06 | Discharge status: Home / Self Care | Payer: Medicare Other | Source: Ambulatory Visit | Attending: Internal Medicine | Admitting: Internal Medicine

## 2023-06-06 DIAGNOSIS — R918 Other nonspecific abnormal finding of lung field: Secondary | ICD-10-CM

## 2023-06-20 ENCOUNTER — Ambulatory Visit: Payer: Medicare Other | Admitting: Internal Medicine

## 2023-06-25 ENCOUNTER — Other Ambulatory Visit: Payer: Self-pay | Admitting: Internal Medicine

## 2023-08-08 ENCOUNTER — Telehealth: Payer: Self-pay | Admitting: *Deleted

## 2023-08-08 NOTE — Telephone Encounter (Signed)
 Methocarbamol  approved 5/2/12025 until further notice

## 2023-08-26 ENCOUNTER — Ambulatory Visit (INDEPENDENT_AMBULATORY_CARE_PROVIDER_SITE_OTHER): Admitting: Neurology

## 2023-08-26 VITALS — BP 125/72 | HR 83

## 2023-08-26 DIAGNOSIS — G43709 Chronic migraine without aura, not intractable, without status migrainosus: Secondary | ICD-10-CM

## 2023-08-26 MED ORDER — ONABOTULINUMTOXINA 200 UNITS IJ SOLR
155.0000 [IU] | Freq: Once | INTRAMUSCULAR | Status: AC
  Administered 2023-08-26: 155 [IU] via INTRAMUSCULAR

## 2023-08-26 NOTE — Progress Notes (Signed)
 Botox - 200 units x 1 vial Lot: [id number]  Expiration: 12/2025 NDC: 9976-6078-97  Bacteriostatic 0.9% Sodium Chloride - 4  mL  Lot: [id number]  Expiration: 12/2024 NDC: 9590-8033-97  Dx: H56.290   B/B Witnessed by Particia PEAK

## 2023-08-26 NOTE — Progress Notes (Signed)
 Consent Form Botulism Toxin Injection For Chronic Migraine  08/26/2023:>80% decrease in freg and severity of migraines and headaches since being on botox . Stable.  06/03/2023; Stable 03/11/2023: >80% decrease in freg and severity of migraines and headaches since being on botox . Stable.  12/17/2022: >80% decrease in freg and severity of migraines and headaches since being on botox . Stable.  09/24/2022: doing great 06/26/2022: Stable, still doing great.  04/02/2022: Transfer of care from Dr. Chalice.   Doing great on botox . Only 4 migraines a month 80% decrease in freg and severity of migraines  since being on botox . Gave her samples of ubrelvy  and nurtec to try as well, explained nurtec can be used acutely and preventatively or in the last 1-2 weeks when botox  is wearing off to avoid breakthrough. Zomig  works well acutely  Meds ordered this encounter  Medications   botulinum toxin Type A  (BOTOX ) injection 155 Units    Botox - 200 units x 1 vial Lot: [id number]  Expiration: 12/2025 NDC: 9976-6078-97  Bacteriostatic 0.9% Sodium Chloride - 4  mL  Lot: [id number]  Expiration: 12/2024 NDC: 9590-8033-97  Dx: H56.290   B/B Witnessed by Particia PEAK       Reviewed orally with patient, additionally signature is on file:  Botulism toxin has been approved by the Federal drug administration for treatment of chronic migraine. Botulism toxin does not cure chronic migraine and it may not be effective in some patients.  The administration of botulism toxin is accomplished by injecting a small amount of toxin into the muscles of the neck and head. Dosage must be titrated for each individual. Any benefits resulting from botulism toxin tend to wear off after 3 months with a repeat injection required if benefit is to be maintained. Injections are usually done every 3-4 months with maximum effect peak achieved by about 2 or 3 weeks. Botulism toxin is expensive and you should be sure of what costs you will incur  resulting from the injection.  The side effects of botulism toxin use for chronic migraine may include:   -Transient, and usually mild, facial weakness with facial injections  -Transient, and usually mild, head or neck weakness with head/neck injections  -Reduction or loss of forehead facial animation due to forehead muscle weakness  -Eyelid drooping  -Dry eye  -Pain at the site of injection or bruising at the site of injection  -Double vision  -Potential unknown long term risks  Contraindications: You should not have Botox  if you are pregnant, nursing, allergic to albumin, have an infection, skin condition, or muscle weakness at the site of the injection, or have myasthenia gravis, Lambert-Eaton syndrome, or ALS.  It is also possible that as with any injection, there may be an allergic reaction or no effect from the medication. Reduced effectiveness after repeated injections is sometimes seen and rarely infection at the injection site may occur. All care will be taken to prevent these side effects. If therapy is given over a long time, atrophy and wasting in the muscle injected may occur. Occasionally the patient's become refractory to treatment because they develop antibodies to the toxin. In this event, therapy needs to be modified.  I have read the above information and consent to the administration of botulism toxin.    BOTOX  PROCEDURE NOTE FOR MIGRAINE HEADACHE    Contraindications and precautions discussed with patient(above). Aseptic procedure was observed and patient tolerated procedure. Procedure performed by Dr. Andree Epp  The condition has existed for more than 6 months, and pt does not have  a diagnosis of ALS, Myasthenia Gravis or Lambert-Eaton Syndrome.  Risks and benefits of injections discussed and pt agrees to proceed with the procedure.  Written consent obtained  These injections are medically necessary. Pt  receives good benefits from these injections. These  injections do not cause sedations or hallucinations which the oral therapies may cause.  Description of procedure:  The patient was placed in a sitting position. The standard protocol was used for Botox  as follows, with 5 units of Botox  injected at each site:   -Procerus muscle, midline injection  -Corrugator muscle, bilateral injection  -Frontalis muscle, bilateral injection, with 2 sites each side, medial injection was performed in the upper one third of the frontalis muscle, in the region vertical from the medial inferior edge of the superior orbital rim. The lateral injection was again in the upper one third of the forehead vertically above the lateral limbus of the cornea, 1.5 cm lateral to the medial injection site.  -Temporalis muscle injection, 4 sites, bilaterally. The first injection was 3 cm above the tragus of the ear, second injection site was 1.5 cm to 3 cm up from the first injection site in line with the tragus of the ear. The third injection site was 1.5-3 cm forward between the first 2 injection sites. The fourth injection site was 1.5 cm posterior to the second injection site.   -Occipitalis muscle injection, 3 sites, bilaterally. The first injection was done one half way between the occipital protuberance and the tip of the mastoid process behind the ear. The second injection site was done lateral and superior to the first, 1 fingerbreadth from the first injection. The third injection site was 1 fingerbreadth superiorly and medially from the first injection site.  -Cervical paraspinal muscle injection, 2 sites, bilateral knee first injection site was 1 cm from the midline of the cervical spine, 3 cm inferior to the lower border of the occipital protuberance. The second injection site was 1.5 cm superiorly and laterally to the first injection site.  -Trapezius muscle injection was performed at 3 sites, bilaterally. The first injection site was in the upper trapezius muscle  halfway between the inflection point of the neck, and the acromion. The second injection site was one half way between the acromion and the first injection site. The third injection was done between the first injection site and the inflection point of the neck.   Will return for repeat injection in 3 months.   200 units of Botox  was used, 45 U Botox  not injected was wasted. The patient tolerated the procedure well, there were no complications of the above procedure.

## 2023-09-13 ENCOUNTER — Ambulatory Visit

## 2023-10-09 ENCOUNTER — Other Ambulatory Visit: Payer: Self-pay | Admitting: Neurology

## 2023-11-07 ENCOUNTER — Encounter (INDEPENDENT_AMBULATORY_CARE_PROVIDER_SITE_OTHER): Payer: Self-pay | Admitting: Otolaryngology

## 2023-11-07 ENCOUNTER — Ambulatory Visit (INDEPENDENT_AMBULATORY_CARE_PROVIDER_SITE_OTHER): Admitting: Otolaryngology

## 2023-11-07 VITALS — BP 126/81 | HR 94

## 2023-11-07 DIAGNOSIS — R0981 Nasal congestion: Secondary | ICD-10-CM | POA: Diagnosis not present

## 2023-11-07 DIAGNOSIS — J310 Chronic rhinitis: Secondary | ICD-10-CM | POA: Diagnosis not present

## 2023-11-07 DIAGNOSIS — J324 Chronic pansinusitis: Secondary | ICD-10-CM

## 2023-11-07 DIAGNOSIS — R0982 Postnasal drip: Secondary | ICD-10-CM | POA: Diagnosis not present

## 2023-11-09 DIAGNOSIS — R0982 Postnasal drip: Secondary | ICD-10-CM | POA: Insufficient documentation

## 2023-11-09 DIAGNOSIS — J324 Chronic pansinusitis: Secondary | ICD-10-CM | POA: Insufficient documentation

## 2023-11-09 NOTE — Progress Notes (Signed)
 Patient ID: Whitney [name], female   DOB: 02-Sep-1955, 68 y.o.   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  The patient has a history of frequent recurrent sinusitis.  She was previously treated with multiple courses of antibiotics, Flonase  nasal spray, and Atrovent nasal sprays.  According to the patient, she recently returned from a trip to [place].  Over the past 3 weeks, she has been experiencing facial pain, nasal congestion, and nasal drainage.  She was treated with 3 weeks of Augmentin .  She also uses Flonase  and Atrovent daily.  Despite the treatment, she continues to have a copious amount of nasal drainage.  She denies any fever or visual change.  Exam: General: Communicates without difficulty, well nourished, no acute distress. Head: Normocephalic, no evidence injury, no tenderness, facial buttresses intact without stepoff. Face/sinus: No tenderness to palpation and percussion. Facial movement is normal and symmetric. Eyes: PERRL, EOMI. No scleral icterus, conjunctivae clear. Neuro: CN II exam reveals vision grossly intact.  No nystagmus at any point of gaze. Ears: Auricles well formed without lesions.  Ear canals are intact without mass or lesion.  No erythema or edema is appreciated.  The TMs are intact without fluid. Nose: External evaluation reveals normal support and skin without lesions.  Dorsum is intact.  Anterior rhinoscopy reveals congested mucosa over anterior aspect of inferior turbinates and intact septum.  Oral:  Oral cavity and oropharynx are intact, symmetric, without erythema or edema.  Mucosa is moist without lesions. Neck: Full range of motion without pain.  There is no significant lymphadenopathy.  No masses palpable.  Thyroid  bed within normal limits to palpation.  Parotid glands and submandibular glands  equal bilaterally without mass.  Trachea is midline. Neuro:  CN 2-12 grossly intact.   Assessment: 1.  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 2.  The patient has not responded to 3 weeks of extended antibiotics.  Plan: 1.  The physical exam findings are reviewed with the patient. 2.  Continue with Flonase  nasal spray and Atrovent nasal spray daily. 3.  The patient is encouraged to increase her hydration. 4.  Sinus CT scan to evaluate the extent of her chronic rhinosinusitis. 5.  The patient will return for reevaluation after her sinus CT scan.

## 2023-11-19 ENCOUNTER — Ambulatory Visit: Admitting: Neurology

## 2023-11-25 ENCOUNTER — Ambulatory Visit: Admitting: Neurology

## 2023-12-16 ENCOUNTER — Telehealth: Payer: Self-pay | Admitting: Neurology

## 2023-12-16 NOTE — Telephone Encounter (Signed)
 I called pt to touch base about her Botox . She states she isn't sure if she wants to r/s with someone else or not. She will read about our other doctors and call back to let us  know.

## 2024-01-08 NOTE — Telephone Encounter (Signed)
 Pt called in very upset and the phone room asked if I could take the call. I spoke with the pt and she relayed frustrations that a letter was not sent out regarding Dr. Sharion departure. I informed her that MyChart message was sent, and per my notes I spoke with her on 10/13 and she wasn't sure if she wanted to reschedule.  I was able to get her rescheduled with Dr. Buck for 11/25 @ 11:15 am. She was due for Botox  in September.

## 2024-01-28 ENCOUNTER — Ambulatory Visit (INDEPENDENT_AMBULATORY_CARE_PROVIDER_SITE_OTHER): Admitting: Neurology

## 2024-01-28 VITALS — BP 124/73 | HR 83

## 2024-01-28 DIAGNOSIS — G43709 Chronic migraine without aura, not intractable, without status migrainosus: Secondary | ICD-10-CM | POA: Diagnosis not present

## 2024-01-28 MED ORDER — ONABOTULINUMTOXINA 200 UNITS IJ SOLR
145.0000 [IU] | Freq: Once | INTRAMUSCULAR | Status: AC
  Administered 2024-01-28: 145 [IU] via INTRAMUSCULAR

## 2024-01-28 NOTE — Progress Notes (Signed)
 Whitney [name] is a 68 yo female with an underlying medical history of chronic migraines, allergic rhinitis, asthma, and reflux disease, who presents for repeat botulinum toxin injection for chronic migraines.  She has previously received injections in this office under the care of Dr. Ines and was last injected in June 2025.  Prior to that until 2023 she received Botox  injections through [facility] neurology, particularly Dr. Chalice.  This is our first injection appointment together.who presents for her botox  inj for her intractable migraines.     Today, 01/28/2024: She has continued to do well with Botox  injections.  She has had no side effects.  She has tried and failed multiple other medications and Botox  injections have worked the best for her.  She had no issues with the last injection but she is certainly overdue for injections.  I did inform her that we are no longer able to provide Botox  injections with a buy and bill service and we are switching to a specialty pharmacy only.  Unfortunately, her insurance has not allowed us  to switch to specialty pharmacy.  She is offered a break from Botox  injection and we could try a different preventative but she declines this.  She reports that she will continue with Botox  injections and would be willing to be referred out.  She is agreeable to being referred out to [facility] neurology again.  She reports that Dr. Chalice is no longer practicing with [facility] neurology and moved out of state as I understand.  She will do some research and look up some providers and then get back to us  as she would like a referral out.  She is encouraged to call us  back or send us  a message through MyChart.  She also reports that she does not wish to get any injection above the eyebrow as she has had droopy eyebrows from the injections.  She also does not wish to have any injection into the forehead below her hairline and I will modify the frontalis injections.  We will skip the  corrugator injections as per her preference today.  Written informed consent for recurrent, 3 monthly intramuscular injections with botulinum toxin for this indication has been obtained prior to the first injection and we are re-consenting every time she returns for an injection.     200 units of botulinum toxin type A  were reconstituted using preservative-free normal saline to a concentration of 10 units per 0.1 mL and drawn up in two 1 mL tuberculin syringes.      Botox - 200  units x 1 vial Lot: [id number] Expiration: 01/2026 NDC: 9976-6078-97   Bacteriostatic 0.9% Sodium Chloride - 2.2 mL  Lot: [id number] Expiration: 12/2024 NDC: 9590-8033-97   Dx: G43.709 B/B Witnessed by Sherrod ,CMA   O/E: BP 124/73   Pulse 83     The patient was situated in a chair, sitting comfortably. After preparing the areas with 70% isopropyl alcohol and using a 26 gauge 1 1/2 inch hollow lumen recording EMG needle for the neck injections as well as a 30 gauge 1 inch needle for the facial injections, a total dose of 145 units of botulinum toxin type A  in the form of Botox  was injected into the muscles and the following distribution and quantities:   #1: 10 units on the right and 10 units in the left frontalis muscles, broken down in 2 sites on each side (above hairline). #2: 15 units in the right and 15 units in the left occipitalis muscles, broken down in 3  sites on each side. #3: 20 units in the right and 20 units in the left temporalis muscles, broken down in 4 sites on each side.  #4: 15 units on the right and 15 units in the left upper trapezius muscles, broken down in 3 sites on each side. #5: 10 units in the right and 10 units in the left splenius capitis muscles, broken down in 2 sites on each side.  #6: 2.5 units in the right and 2.5 units in the left procerus muscles.     EMG guidance was utilized for the neck injections with minimal to mild EMG activity noted, fairly symmetrically on both sides,  perhaps slightly worse on the right side but notable in the upper trap and splenius capitis muscles bilaterally.     A dose of 55 units out of a total dose of 200 units was discarded as unavoidable waste.   The patient tolerated the procedure well without immediate complications. She was advised to let us  know if she would like a referral out to a different neurologist such as with [facility] neurology where she previously received Botox  injections.  She will let us  know.   She was in agreement and did not have any questions prior to leaving clinic today.  Previously (copied from previous notes for reference):    08/26/2023 (AA):>80% decrease in freg and severity of migraines and headaches since being on botox . Stable.  06/03/2023; Stable 03/11/2023: >80% decrease in freg and severity of migraines and headaches since being on botox . Stable.  12/17/2022: >80% decrease in freg and severity of migraines and headaches since being on botox . Stable.  09/24/2022: doing great 06/26/2022: Stable, still doing great.  04/02/2022: Transfer of care from Dr. Chalice.    Doing great on botox . Only 4 migraines a month 80% decrease in freg and severity of migraines  since being on botox . Gave her samples of ubrelvy  and nurtec to try as well, explained nurtec can be used acutely and preventatively or in the last 1-2 weeks when botox  is wearing off to avoid breakthrough. Zomig  works well acutely       Meds ordered this encounter  Medications   botulinum toxin Type A  (BOTOX ) injection 155 Units      Botox - 200 units x 1 vial Lot: [id number]  Expiration: 12/2025 NDC: 9976-6078-97   Bacteriostatic 0.9% Sodium Chloride - 4  mL  Lot: [id number]  Expiration: 12/2024 NDC: 9590-8033-97   Dx: H56.290   B/B Witnessed by Particia PEAK            Reviewed orally with patient, additionally signature is on file:   Botulism toxin has been approved by the Federal drug administration for treatment of chronic migraine. Botulism  toxin does not cure chronic migraine and it may not be effective in some patients.   The administration of botulism toxin is accomplished by injecting a small amount of toxin into the muscles of the neck and head. Dosage must be titrated for each individual. Any benefits resulting from botulism toxin tend to wear off after 3 months with a repeat injection required if benefit is to be maintained. Injections are usually done every 3-4 months with maximum effect peak achieved by about 2 or 3 weeks. Botulism toxin is expensive and you should be sure of what costs you will incur resulting from the injection.   The side effects of botulism toxin use for chronic migraine may include:               -Transient,  and usually mild, facial weakness with facial injections             -Transient, and usually mild, head or neck weakness with head/neck injections             -Reduction or loss of forehead facial animation due to forehead muscle weakness             -Eyelid drooping             -Dry eye             -Pain at the site of injection or bruising at the site of injection             -Double vision             -Potential unknown long term risks   Contraindications: You should not have Botox  if you are pregnant, nursing, allergic to albumin, have an infection, skin condition, or muscle weakness at the site of the injection, or have myasthenia gravis, Lambert-Eaton syndrome, or ALS.   It is also possible that as with any injection, there may be an allergic reaction or no effect from the medication. Reduced effectiveness after repeated injections is sometimes seen and rarely infection at the injection site may occur. All care will be taken to prevent these side effects. If therapy is given over a long time, atrophy and wasting in the muscle injected may occur. Occasionally the patient's become refractory to treatment because they develop antibodies to the toxin. In this event, therapy needs to be modified.    I have read the above information and consent to the administration of botulism toxin.       BOTOX  PROCEDURE NOTE FOR MIGRAINE HEADACHE       Contraindications and precautions discussed with patient(above). Aseptic procedure was observed and patient tolerated procedure. Procedure performed by Dr. Andree Epp   The condition has existed for more than 6 months, and pt does not have a diagnosis of ALS, Myasthenia Gravis or Lambert-Eaton Syndrome.  Risks and benefits of injections discussed and pt agrees to proceed with the procedure.  Written consent obtained   These injections are medically necessary. Pt  receives good benefits from these injections. These injections do not cause sedations or hallucinations which the oral therapies may cause.   Description of procedure:   The patient was placed in a sitting position. The standard protocol was used for Botox  as follows, with 5 units of Botox  injected at each site:     -Procerus muscle, midline injection   -Corrugator muscle, bilateral injection   -Frontalis muscle, bilateral injection, with 2 sites each side, medial injection was performed in the upper one third of the frontalis muscle, in the region vertical from the medial inferior edge of the superior orbital rim. The lateral injection was again in the upper one third of the forehead vertically above the lateral limbus of the cornea, 1.5 cm lateral to the medial injection site.   -Temporalis muscle injection, 4 sites, bilaterally. The first injection was 3 cm above the tragus of the ear, second injection site was 1.5 cm to 3 cm up from the first injection site in line with the tragus of the ear. The third injection site was 1.5-3 cm forward between the first 2 injection sites. The fourth injection site was 1.5 cm posterior to the second injection site.    -Occipitalis muscle injection, 3 sites, bilaterally. The first injection was done one half way between the occipital protuberance and  the tip of the mastoid process behind the ear. The second injection site was done lateral and superior to the first, 1 fingerbreadth from the first injection. The third injection site was 1 fingerbreadth superiorly and medially from the first injection site.   -Cervical paraspinal muscle injection, 2 sites, bilateral knee first injection site was 1 cm from the midline of the cervical spine, 3 cm inferior to the lower border of the occipital protuberance. The second injection site was 1.5 cm superiorly and laterally to the first injection site.   -Trapezius muscle injection was performed at 3 sites, bilaterally. The first injection site was in the upper trapezius muscle halfway between the inflection point of the neck, and the acromion. The second injection site was one half way between the acromion and the first injection site. The third injection was done between the first injection site and the inflection point of the neck.     Will return for repeat injection in 3 months.     200 units of Botox  was used, 45 U Botox  not injected was wasted. The patient tolerated the procedure well, there were no complications of the above procedure.

## 2024-01-28 NOTE — Patient Instructions (Signed)
 Please let us  know if you would like to see someone at [facility] neurology for ongoing Botox  injections.  We can also consider referring you to [facility] physical medicine and rehab, Dr. Carilyn in particular, who may be offering Botox  injections, we can certainly pursue a referral to him as well if he would prefer.  Please call us  or email us  through MyChart to let us  know your preference. Another option would be to take a break from Botox  injections and try a different preventative which I would be happy to consider with you, if you would like to continue your care through our office.
